# Patient Record
Sex: Female | Born: 1995
Health system: Southern US, Community
[De-identification: ages and names within clinical notes are randomized; demographics above are authoritative.]

---
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
## 2017-01-02 LAB — CULTURE, GROUP A STREP (THRC)

## 2017-03-13 ENCOUNTER — Encounter (HOSPITAL_BASED_OUTPATIENT_CLINIC_OR_DEPARTMENT_OTHER): Payer: Self-pay | Admitting: Emergency Medicine

## 2017-03-13 ENCOUNTER — Emergency Department (HOSPITAL_BASED_OUTPATIENT_CLINIC_OR_DEPARTMENT_OTHER)
Admission: EM | Admit: 2017-03-13 | Discharge: 2017-03-13 | Disposition: A | Discharge status: Home / Self Care | Payer: Medicaid Other | Attending: Emergency Medicine | Admitting: Emergency Medicine

## 2017-03-13 DIAGNOSIS — X58XXXA Exposure to other specified factors, initial encounter: Secondary | ICD-10-CM | POA: Insufficient documentation

## 2017-03-13 DIAGNOSIS — Y929 Unspecified place or not applicable: Secondary | ICD-10-CM | POA: Insufficient documentation

## 2017-03-13 DIAGNOSIS — Y939 Activity, unspecified: Secondary | ICD-10-CM | POA: Insufficient documentation

## 2017-03-13 DIAGNOSIS — Y990 Civilian activity done for income or pay: Secondary | ICD-10-CM | POA: Insufficient documentation

## 2017-03-13 DIAGNOSIS — M25531 Pain in right wrist: Secondary | ICD-10-CM | POA: Insufficient documentation

## 2017-03-13 MED ORDER — IBUPROFEN 800 MG PO TABS: 800.0000 mg | ORAL_TABLET | Freq: Three times a day (TID) | ORAL | 0 refills | Status: AC

## 2017-03-13 NOTE — ED Triage Notes (Signed)
R wrist pain x 2 weeks. No injury. Pain radiates into thumb.

## 2017-03-13 NOTE — ED Provider Notes (Signed)
[facility]   CSN: [id number] Arrival date & time: 03/13/17  1945  By signing my name below, I, Octavia Heir, attest that this documentation has been prepared under the direction and in the presence of Sharilyn Sites, PA-C.  Electronically Signed: Octavia Heir, ED Scribe. 03/13/17. 9:01 PM.    History   Chief Complaint Chief Complaint  Patient presents with  . Wrist Pain   The history is provided by the patient. No language interpreter was used.    HPI Comments: Whitney Gibson is a 21 y.o. female who is right hand dominant presents to the Emergency Department complaining of moderate, persisting right wrist pain s/p an injury that occurred 2 weeks ago. Pt says she was at work when she injured her wrist in an unknown way. She expresses for the past several days she has been having persistent pain in her right wrist that now radiates into her right thumb. Pt reports increased pain when she flexes or extends her right thumb, especially when eating. She notes she uses her thumb frequently at work, stating she types a lot on the computer. Patient is right hand dominant.  She denies numbness/weakness of right hand.  History reviewed. No pertinent past medical history.  There are no active problems to display for this patient.   History reviewed. No pertinent surgical history.  OB History    No data available       Home Medications    Prior to Admission medications   Not on File    Family History No family history on file.  Social History Social History  Substance Use Topics  . Smoking status: Never Smoker  . Smokeless tobacco: Never Used  . Alcohol use No     Allergies   Patient has no known allergies.   Review of Systems Review of Systems  Musculoskeletal: Positive for arthralgias.  All other systems reviewed and are negative.    Physical Exam Updated Vital Signs BP (!) 141/94 (BP Location: Left Arm)   Pulse (!) 105   Temp 98.3  F (36.8 C) (Oral)   Resp 18   Ht  (1.575 m)   Wt 155 lb (70.3 kg)   SpO2 100%   BMI 28.35 kg/m   Physical Exam  Constitutional: She is oriented to person, place, and time. She appears well-developed and well-nourished.  HENT:  Head: Normocephalic and atraumatic.  Mouth/Throat: Oropharynx is clear and moist.  Eyes: Conjunctivae and EOM are normal. Pupils are equal, round, and reactive to light.  Neck: Normal range of motion.  Cardiovascular: Normal rate, regular rhythm and normal heart sounds.   Pulmonary/Chest: Effort normal and breath sounds normal.  Abdominal: Soft. Bowel sounds are normal. She exhibits no distension. There is no rebound and no guarding.  Musculoskeletal: Normal range of motion.  Right wrist overall normal in appearance without visible swelling or bony deformity, no signs,, pain reproduced with movement of the right thumb as well as inversion and eversion of the wrist, normal strength, no wrist drop, negative Tinel's and Phalen's sign, strong radial pulse and cap refill, normal sensation throughout hand and all fingers  Neurological: She is alert and oriented to person, place, and time.  Skin: Skin is warm and dry.  Psychiatric: She has a normal mood and affect.  Nursing note and vitals reviewed.    ED Treatments / Results  DIAGNOSTIC STUDIES: Oxygen Saturation is 100% on RA, normal by my interpretation.  COORDINATION OF CARE:  9:00 PM Discussed  treatment plan with pt at bedside and pt agreed to plan.  Labs (all labs ordered are listed, but only abnormal results are displayed) Labs Reviewed - No data to display  EKG  EKG Interpretation None       Radiology No results found.  Procedures Procedures (including critical care time)  Medications Ordered in ED Medications - No data to display   Initial Impression / Assessment and Plan / ED Course  I have reviewed the triage vital signs and the nursing notes.  Pertinent labs & imaging  results that were available during my care of the patient were reviewed by me and considered in my medical decision making (see chart for details).  21 year old female here with atraumatic right wrist pain. She has no swelling or bony deformity noted on exam. She does have pain with movement of the thumb as well as eversion and inversion of the wrist. She has normal grip strength, no wrist drop. No overlying erythema or warmth to touch. Not clinically consistent with fracture, dislocation, or septic joint. Feel this is likely tendinitis. She does admit doing repetitive motions at work and types on the computer a lot.  We'll place a wrist splint for comfort, start anti-inflammatories. She was given hand surgery follow-up for any ongoing issues.  Discussed plan with patient, she acknowledged understanding and agreed with plan of care.  Return precautions given for new or worsening symptoms.  Final Clinical Impressions(s) / ED Diagnoses   Final diagnoses:  Right wrist pain    New Prescriptions Discharge Medication List as of 03/13/2017  9:27 PM    START taking these medications   Details  ibuprofen (ADVIL,MOTRIN) 800 MG tablet Take 1 tablet (800 mg total) by mouth 3 (three) times daily., Starting Sun 03/13/2017, Print       I personally performed the services described in this documentation, which was scribed in my presence. The recorded information has been reviewed and is accurate.   Garlon Hatchet, PA-C 03/13/17 2212    Lyndal Pulley, MD 03/14/17 [phone]

## 2017-03-13 NOTE — ED Notes (Signed)
Pt given d/c instructions as per chart. Rx x 1. Verbalizes understanding. No questions. 

## 2017-03-13 NOTE — Discharge Instructions (Signed)
Take the prescribed medication as directed.  Wear wrist splint for the next week or so until wrist is feeling better. Follow-up with hand specialist if you continue having ongoing issues. Return to the ED for new or worsening symptoms.

## 2017-07-16 DIAGNOSIS — K353 Acute appendicitis with localized peritonitis: Principal | ICD-10-CM | POA: Insufficient documentation

## 2017-07-17 ENCOUNTER — Emergency Department (HOSPITAL_COMMUNITY): Payer: Self-pay | Admitting: Certified Registered"

## 2017-07-17 ENCOUNTER — Encounter (HOSPITAL_BASED_OUTPATIENT_CLINIC_OR_DEPARTMENT_OTHER): Payer: Self-pay | Admitting: Emergency Medicine

## 2017-07-17 ENCOUNTER — Encounter (HOSPITAL_COMMUNITY)
Admission: EM | Disposition: A | Discharge status: Home / Self Care | Payer: Self-pay | Source: Home / Self Care | Attending: Emergency Medicine

## 2017-07-17 ENCOUNTER — Emergency Department (HOSPITAL_BASED_OUTPATIENT_CLINIC_OR_DEPARTMENT_OTHER): Payer: Self-pay

## 2017-07-17 ENCOUNTER — Observation Stay (HOSPITAL_BASED_OUTPATIENT_CLINIC_OR_DEPARTMENT_OTHER)
Admission: EM | Admit: 2017-07-17 | Discharge: 2017-07-18 | Disposition: A | Discharge status: Home / Self Care | Payer: Self-pay | Attending: Surgery | Admitting: Surgery

## 2017-07-17 DIAGNOSIS — K3580 Unspecified acute appendicitis: Secondary | ICD-10-CM | POA: Diagnosis present

## 2017-07-17 HISTORY — PX: LAPAROSCOPIC APPENDECTOMY: SHX408

## 2017-07-17 LAB — URINALYSIS, ROUTINE W REFLEX MICROSCOPIC
Bilirubin Urine: NEGATIVE
Glucose, UA: NEGATIVE mg/dL
Hgb urine dipstick: NEGATIVE
Ketones, ur: NEGATIVE mg/dL
Leukocytes, UA: NEGATIVE
Nitrite: NEGATIVE
Protein, ur: NEGATIVE mg/dL
Specific Gravity, Urine: 1.029 (ref 1.005–1.030)
pH: 7 (ref 5.0–8.0)

## 2017-07-17 LAB — BASIC METABOLIC PANEL
Anion gap: 8 (ref 5–15)
BUN: 10 mg/dL (ref 6–20)
CO2: 26 mmol/L (ref 22–32)
Calcium: 9.1 mg/dL (ref 8.9–10.3)
Chloride: 104 mmol/L (ref 101–111)
Creatinine, Ser: 0.6 mg/dL (ref 0.44–1.00)
GFR calc Af Amer: >60 mL/min (ref >60)
GFR calc non Af Amer: >60 mL/min (ref >60)
Glucose, Bld: 122 mg/dL — ABNORMAL HIGH (ref 65–99)
Potassium: 3.2 mmol/L — ABNORMAL LOW (ref 3.5–5.1)
Sodium: 138 mmol/L (ref 135–145)

## 2017-07-17 LAB — CBC WITH DIFFERENTIAL/PLATELET
Basophils Absolute: 0 10*3/uL (ref 0.0–0.1)
Basophils Relative: 0 %
Eosinophils Absolute: 0.1 10*3/uL (ref 0.0–0.7)
Eosinophils Relative: 1 %
HCT: 38.9 % (ref 36.0–46.0)
Hemoglobin: 13.2 g/dL (ref 12.0–15.0)
Lymphocytes Relative: 21 %
Lymphs Abs: 2.3 10*3/uL (ref 0.7–4.0)
MCH: 31.4 pg (ref 26.0–34.0)
MCHC: 33.9 g/dL (ref 30.0–36.0)
MCV: 92.6 fL (ref 78.0–100.0)
Monocytes Absolute: 1.1 10*3/uL — ABNORMAL HIGH (ref 0.1–1.0)
Monocytes Relative: 9 %
Neutro Abs: 7.8 10*3/uL — ABNORMAL HIGH (ref 1.7–7.7)
Neutrophils Relative %: 69 %
Platelets: 248 10*3/uL (ref 150–400)
RBC: 4.2 MIL/uL (ref 3.87–5.11)
RDW: 12 % (ref 11.5–15.5)
WBC: 11.3 10*3/uL — ABNORMAL HIGH (ref 4.0–10.5)

## 2017-07-17 LAB — PREGNANCY, URINE: Preg Test, Ur: NEGATIVE

## 2017-07-17 SURGERY — APPENDECTOMY, LAPAROSCOPIC
Anesthesia: General | Site: Abdomen

## 2017-07-17 MED ORDER — BUPIVACAINE-EPINEPHRINE 0.25% -1:200000 IJ SOLN
INTRAMUSCULAR | Status: DC | PRN
  Administered 2017-07-17: 30 mL

## 2017-07-17 MED ORDER — HYDROMORPHONE HCL-NACL 0.5-0.9 MG/ML-% IV SOSY: 0.2500 mg | PREFILLED_SYRINGE | INTRAVENOUS | Status: DC | PRN

## 2017-07-17 MED ORDER — SCOPOLAMINE 1 MG/3DAYS TD PT72
MEDICATED_PATCH | TRANSDERMAL | Status: AC
  Filled 2017-07-17: qty 1

## 2017-07-17 MED ORDER — ROCURONIUM BROMIDE 10 MG/ML (PF) SYRINGE
PREFILLED_SYRINGE | INTRAVENOUS | Status: DC | PRN
  Administered 2017-07-17: 30 mg via INTRAVENOUS

## 2017-07-17 MED ORDER — ONDANSETRON 4 MG PO TBDP: 4.0000 mg | ORAL_TABLET | Freq: Four times a day (QID) | ORAL | Status: DC | PRN

## 2017-07-17 MED ORDER — PROMETHAZINE HCL 25 MG/ML IJ SOLN: 6.2500 mg | INTRAMUSCULAR | Status: DC | PRN

## 2017-07-17 MED ORDER — MIDAZOLAM HCL 2 MG/2ML IJ SOLN
INTRAMUSCULAR | Status: AC
  Filled 2017-07-17: qty 2

## 2017-07-17 MED ORDER — HYDROMORPHONE HCL-NACL 0.5-0.9 MG/ML-% IV SOSY
PREFILLED_SYRINGE | INTRAVENOUS | Status: AC
  Filled 2017-07-17: qty 1

## 2017-07-17 MED ORDER — 0.9 % SODIUM CHLORIDE (POUR BTL) OPTIME
TOPICAL | Status: DC | PRN
  Administered 2017-07-17: 1000 mL

## 2017-07-17 MED ORDER — CEFTRIAXONE SODIUM 2 G IJ SOLR
2.0000 g | INTRAMUSCULAR | Status: DC
  Administered 2017-07-18: 2 g via INTRAVENOUS
  Filled 2017-07-17: qty 2

## 2017-07-17 MED ORDER — ONDANSETRON HCL 4 MG/2ML IJ SOLN
4.0000 mg | Freq: Four times a day (QID) | INTRAMUSCULAR | Status: DC | PRN
  Administered 2017-07-17: 4 mg via INTRAVENOUS
  Filled 2017-07-17: qty 2

## 2017-07-17 MED ORDER — PROPOFOL 10 MG/ML IV BOLUS
INTRAVENOUS | Status: AC
  Filled 2017-07-17: qty 20

## 2017-07-17 MED ORDER — TRAMADOL HCL 50 MG PO TABS: 50.0000 mg | ORAL_TABLET | Freq: Four times a day (QID) | ORAL | Status: DC | PRN

## 2017-07-17 MED ORDER — PROPOFOL 10 MG/ML IV BOLUS
INTRAVENOUS | Status: DC | PRN
  Administered 2017-07-17: 180 mg via INTRAVENOUS

## 2017-07-17 MED ORDER — SUCCINYLCHOLINE CHLORIDE 200 MG/10ML IV SOSY
PREFILLED_SYRINGE | INTRAVENOUS | Status: DC | PRN
  Administered 2017-07-17: 120 mg via INTRAVENOUS

## 2017-07-17 MED ORDER — KETOROLAC TROMETHAMINE 15 MG/ML IJ SOLN
15.0000 mg | Freq: Once | INTRAMUSCULAR | Status: AC
  Administered 2017-07-17: 15 mg via INTRAVENOUS
  Filled 2017-07-17: qty 1

## 2017-07-17 MED ORDER — SUGAMMADEX SODIUM 200 MG/2ML IV SOLN
INTRAVENOUS | Status: DC | PRN
  Administered 2017-07-17: 200 mg via INTRAVENOUS

## 2017-07-17 MED ORDER — LACTATED RINGERS IR SOLN
Status: DC | PRN
  Administered 2017-07-17: 3000 mL

## 2017-07-17 MED ORDER — DEXTROSE 5 % IV SOLN
2.0000 g | Freq: Once | INTRAVENOUS | Status: AC
  Administered 2017-07-17: 2 g via INTRAVENOUS
  Filled 2017-07-17: qty 2

## 2017-07-17 MED ORDER — ONDANSETRON HCL 4 MG/2ML IJ SOLN
4.0000 mg | Freq: Once | INTRAMUSCULAR | Status: AC
  Administered 2017-07-17: 4 mg via INTRAVENOUS
  Filled 2017-07-17: qty 2

## 2017-07-17 MED ORDER — DEXAMETHASONE SODIUM PHOSPHATE 10 MG/ML IJ SOLN
INTRAMUSCULAR | Status: DC | PRN
  Administered 2017-07-17: 10 mg via INTRAVENOUS

## 2017-07-17 MED ORDER — FENTANYL CITRATE (PF) 250 MCG/5ML IJ SOLN
INTRAMUSCULAR | Status: AC
  Filled 2017-07-17: qty 5

## 2017-07-17 MED ORDER — SODIUM CHLORIDE 0.9 % IV BOLUS (SEPSIS)
1000.0000 mL | Freq: Once | INTRAVENOUS | Status: AC
  Administered 2017-07-17: 1000 mL via INTRAVENOUS

## 2017-07-17 MED ORDER — METRONIDAZOLE IN NACL 5-0.79 MG/ML-% IV SOLN
500.0000 mg | Freq: Once | INTRAVENOUS | Status: AC
  Administered 2017-07-17: 500 mg via INTRAVENOUS
  Filled 2017-07-17: qty 100

## 2017-07-17 MED ORDER — FENTANYL CITRATE (PF) 250 MCG/5ML IJ SOLN
INTRAMUSCULAR | Status: DC | PRN
  Administered 2017-07-17: 100 ug via INTRAVENOUS
  Administered 2017-07-17 (×3): 50 ug via INTRAVENOUS

## 2017-07-17 MED ORDER — MORPHINE SULFATE (PF) 4 MG/ML IV SOLN
4.0000 mg | Freq: Once | INTRAVENOUS | Status: AC
  Administered 2017-07-17: 4 mg via INTRAVENOUS
  Filled 2017-07-17: qty 1

## 2017-07-17 MED ORDER — MORPHINE SULFATE (PF) 2 MG/ML IV SOLN
1.0000 mg | INTRAVENOUS | Status: DC | PRN
  Administered 2017-07-17: 1 mg via INTRAVENOUS
  Filled 2017-07-17: qty 1

## 2017-07-17 MED ORDER — LIDOCAINE 2% (20 MG/ML) 5 ML SYRINGE
INTRAMUSCULAR | Status: DC | PRN
  Administered 2017-07-17: 80 mg via INTRAVENOUS

## 2017-07-17 MED ORDER — ONDANSETRON HCL 4 MG/2ML IJ SOLN
INTRAMUSCULAR | Status: DC | PRN
  Administered 2017-07-17: 4 mg via INTRAVENOUS

## 2017-07-17 MED ORDER — IOPAMIDOL (ISOVUE-300) INJECTION 61%
100.0000 mL | Freq: Once | INTRAVENOUS | Status: AC | PRN
  Administered 2017-07-17: 100 mL via INTRAVENOUS

## 2017-07-17 MED ORDER — KCL IN DEXTROSE-NACL 20-5-0.45 MEQ/L-%-% IV SOLN
INTRAVENOUS | Status: DC
  Administered 2017-07-17 – 2017-07-18 (×2): via INTRAVENOUS
  Filled 2017-07-17 (×2): qty 1000

## 2017-07-17 MED ORDER — ENOXAPARIN SODIUM 40 MG/0.4ML ~~LOC~~ SOLN
40.0000 mg | SUBCUTANEOUS | Status: DC
  Administered 2017-07-18: 40 mg via SUBCUTANEOUS
  Filled 2017-07-17: qty 0.4

## 2017-07-17 MED ORDER — BUPIVACAINE-EPINEPHRINE (PF) 0.25% -1:200000 IJ SOLN
INTRAMUSCULAR | Status: AC
  Filled 2017-07-17: qty 30

## 2017-07-17 MED ORDER — MIDAZOLAM HCL 2 MG/2ML IJ SOLN
INTRAMUSCULAR | Status: DC | PRN
  Administered 2017-07-17: 2 mg via INTRAVENOUS

## 2017-07-17 MED ORDER — HYDROCODONE-ACETAMINOPHEN 5-325 MG PO TABS
1.0000 | ORAL_TABLET | ORAL | Status: DC | PRN
  Administered 2017-07-17 – 2017-07-18 (×2): 1 via ORAL
  Filled 2017-07-17: qty 1
  Filled 2017-07-17: qty 2

## 2017-07-17 MED ORDER — METRONIDAZOLE IN NACL 5-0.79 MG/ML-% IV SOLN
500.0000 mg | Freq: Three times a day (TID) | INTRAVENOUS | Status: DC
  Administered 2017-07-17 – 2017-07-18 (×3): 500 mg via INTRAVENOUS
  Filled 2017-07-17 (×4): qty 100

## 2017-07-17 MED ORDER — LACTATED RINGERS IV SOLN
INTRAVENOUS | Status: DC | PRN
  Administered 2017-07-17 (×2): via INTRAVENOUS

## 2017-07-17 SURGICAL SUPPLY — 34 items
APPLIER CLIP ROT 10 11.4 M/L (STAPLE)
BENZOIN TINCTURE PRP APPL 2/3 (GAUZE/BANDAGES/DRESSINGS) IMPLANT
CABLE HIGH FREQUENCY MONO STRZ (ELECTRODE) ×3 IMPLANT
CHLORAPREP W/TINT 26ML (MISCELLANEOUS) ×3 IMPLANT
CLIP APPLIE ROT 10 11.4 M/L (STAPLE) IMPLANT
CLOSURE WOUND 1/2 X4 (GAUZE/BANDAGES/DRESSINGS)
COVER SURGICAL LIGHT HANDLE (MISCELLANEOUS) ×3 IMPLANT
CUTTER FLEX LINEAR 45M (STAPLE) ×3 IMPLANT
DECANTER SPIKE VIAL GLASS SM (MISCELLANEOUS) ×3 IMPLANT
DERMABOND ADVANCED (GAUZE/BANDAGES/DRESSINGS) ×2
DERMABOND ADVANCED .7 DNX12 (GAUZE/BANDAGES/DRESSINGS) ×1 IMPLANT
DRAPE LAPAROSCOPIC ABDOMINAL (DRAPES) ×3 IMPLANT
ELECT REM PT RETURN 15FT ADLT (MISCELLANEOUS) ×3 IMPLANT
ENDOLOOP SUT PDS II  0 18 (SUTURE)
ENDOLOOP SUT PDS II 0 18 (SUTURE) IMPLANT
GLOVE SURG SIGNA 7.5 PF LTX (GLOVE) ×3 IMPLANT
GOWN STRL REUS W/TWL XL LVL3 (GOWN DISPOSABLE) ×6 IMPLANT
IRRIG SUCT STRYKERFLOW 2 WTIP (MISCELLANEOUS) ×3
IRRIGATION SUCT STRKRFLW 2 WTP (MISCELLANEOUS) ×1 IMPLANT
KIT BASIN OR (CUSTOM PROCEDURE TRAY) ×3 IMPLANT
POUCH SPECIMEN RETRIEVAL 10MM (ENDOMECHANICALS) ×3 IMPLANT
RELOAD 45 VASCULAR/THIN (ENDOMECHANICALS) IMPLANT
RELOAD STAPLE TA45 3.5 REG BLU (ENDOMECHANICALS) ×3 IMPLANT
SCISSORS LAP 5X35 DISP (ENDOMECHANICALS) ×3 IMPLANT
SHEARS HARMONIC ACE PLUS 36CM (ENDOMECHANICALS) ×3 IMPLANT
SLEEVE XCEL OPT CAN 5 100 (ENDOMECHANICALS) ×3 IMPLANT
STRIP CLOSURE SKIN 1/2X4 (GAUZE/BANDAGES/DRESSINGS) IMPLANT
SUT MNCRL AB 4-0 PS2 18 (SUTURE) ×3 IMPLANT
SUT VIC AB 2-0 SH 18 (SUTURE) IMPLANT
TOWEL OR 17X26 10 PK STRL BLUE (TOWEL DISPOSABLE) ×3 IMPLANT
TOWEL OR NON WOVEN STRL DISP B (DISPOSABLE) ×3 IMPLANT
TRAY LAPAROSCOPIC (CUSTOM PROCEDURE TRAY) ×3 IMPLANT
TROCAR BLADELESS OPT 5 100 (ENDOMECHANICALS) ×3 IMPLANT
TROCAR XCEL BLUNT TIP 100MML (ENDOMECHANICALS) ×3 IMPLANT

## 2017-07-17 NOTE — ED Provider Notes (Signed)
[facility]   CSN: [id number] Arrival date & time: 07/16/17  2343   By signing my name below, I, Whitney Gibson, attest that this documentation has been prepared under the direction and in the presence of Whitney Gibson, Whitney Choudhry, MD. Electronically signed, Whitney Gibson, ED Scribe. 07/17/17. 1:40 AM.   History   Chief Complaint Chief Complaint  Patient presents with  . Pelvic Pain   The history is provided by the patient and medical records. No language interpreter was used.    Whitney Gibson is a 21 y.o. female presenting to the Emergency Department concerning R lower abdominal pain x ~24 hours. Associated nausea. She describes 7/10, constant aching and states movement makes her pain worse. She states she has taken Tylenol with mild relief. H/o simlar noted "a few years ago" and 1 episode "a few weeks ago" that subsided after she took tylenol. No h/o abdominal surgeries. No bleeding, discharge, fever, chills or any other complaints noted at this time.   History reviewed. No pertinent past medical history.  There are no active problems to display for this patient.   History reviewed. No pertinent surgical history.  OB History    No data available       Home Medications    Prior to Admission medications   Medication Sig Start Date End Date Taking? Authorizing Provider  ibuprofen (ADVIL,MOTRIN) 800 MG tablet Take 1 tablet (800 mg total) by mouth 3 (three) times daily. 03/13/17   Garlon HatchetSanders, Lisa M, PA-C    Family History History reviewed. No pertinent family history.  Social History Social History  Substance Use Topics  . Smoking status: Never Smoker  . Smokeless tobacco: Never Used  . Alcohol use No     Allergies   Patient has no known allergies.   Review of Systems Review of Systems  Constitutional: Negative for chills, diaphoresis and fever.  Gastrointestinal: Positive for abdominal pain and nausea. Negative for diarrhea and vomiting.  Skin:  Negative for color change and wound.  Neurological: Negative for weakness and numbness.  All other systems reviewed and are negative.    Physical Exam Updated Vital Signs BP (!) 142/79 (BP Location: Right Arm)   Pulse 100   Temp 98.2 F (36.8 C) (Oral)   Resp 19   Ht 5\' 4"  (1.626 m)   Wt 170 lb (77.1 kg)   LMP 07/16/2017   SpO2 100%   BMI 29.18 kg/m   Physical Exam  Abdominal: There is tenderness in the right lower quadrant and suprapubic area. There is guarding (voluntary guarding).     ED Treatments / Results  DIAGNOSTIC STUDIES: Oxygen Saturation is 100% on RA, NL by my interpretation.    COORDINATION OF CARE: 1:39 AM-Discussed next steps with pt. Pt verbalized understanding and is agreeable with the plan. Will order blood work and labs.   Labs (all labs ordered are listed, but only abnormal results are displayed) Labs Reviewed  CBC WITH DIFFERENTIAL/PLATELET - Abnormal; Notable for the following:       Result Value   WBC 11.3 (*)    Neutro Abs 7.8 (*)    Monocytes Absolute 1.1 (*)    All other components within normal limits  BASIC METABOLIC PANEL - Abnormal; Notable for the following:    Potassium 3.2 (*)    Glucose, Bld 122 (*)    All other components within normal limits  PREGNANCY, URINE  URINALYSIS, ROUTINE W REFLEX MICROSCOPIC    EKG  EKG Interpretation None  Radiology Ct Abdomen Pelvis W Contrast  Result Date: 07/17/2017 CLINICAL DATA:  Initial evaluation for acute abdominal pain. EXAM: CT ABDOMEN AND PELVIS WITH CONTRAST TECHNIQUE: Multidetector CT imaging of the abdomen and pelvis was performed using the standard protocol following bolus administration of intravenous contrast. CONTRAST:  100mL ISOVUE-300 IOPAMIDOL (ISOVUE-300) INJECTION 61% COMPARISON:  None. FINDINGS: Lower chest: Visualized lung bases are clear. Hepatobiliary: Liver within normal limits. Gallbladder normal. No biliary dilatation. Pancreas: Pancreas within normal limits.  Spleen: Spleen within normal limits. Adrenals/Urinary Tract: The adrenal glands are normal. Kidneys equal size with symmetric enhancement. No nephrolithiasis, hydronephrosis, or focal enhancing renal mass. No hydroureter. Bladder within normal limits. Stomach/Bowel: Stomach within normal limits. No evidence for bowel obstruction. Tubular structure extending medially from the cecum favored to reflect the appendix measures up to 13 mm (series 2, image 52). Adjacent hazy inflammatory stranding, suspicious for acute appendicitis. Relative sparing of the base of the appendix. No evidence for perforation or other complication. No other acute inflammatory changes seen about the bowels. Vascular/Lymphatic: Normal intravascular enhancement seen throughout the intra-abdominal aorta and its branch vessels. No pathologically enlarged intra-abdominal or pelvic lymph nodes. Reproductive: Uterus and ovaries within normal limits for age. Other: No free intraperitoneal air.  No significant free fluid. Musculoskeletal: No acute osseus abnormality. No worrisome lytic or blastic osseous lesions. IMPRESSION: Findings consistent with acute appendicitis. No evidence for perforation or other complication. Electronically Signed   By: Rise MuBenjamin  McClintock M.D.   On: 07/17/2017 03:28    Procedures Procedures (including critical care time)  Medications Ordered in ED Medications - No data to display   Initial Impression / Assessment and Plan / ED Course  I have reviewed the triage vital signs and the nursing notes.  Pertinent labs & imaging results that were available during my care of the patient were reviewed by me and considered in my medical decision making (see chart for details).     21yF with lower abdominal/RLQ pain. CT with likely appendicitis w/o perf or abscess. NPO. Abx. Surgery consultation.   3:52 AM Discussed with Dr Ezzard StandingNewman, surgery. [facility] Pt/family updated.    Final Clinical Impressions(s) /  ED Diagnoses   Final diagnoses:  Acute appendicitis, unspecified acute appendicitis type    New Prescriptions New Prescriptions   No medications on file    I personally preformed the services scribed in my presence. The recorded information has been reviewed is accurate. Whitney RazorStephen Louvina Cleary, MD.    Whitney Gibson, Mattison Golay, MD 07/17/17 [phone]

## 2017-07-17 NOTE — Anesthesia Preprocedure Evaluation (Addendum)
Anesthesia Evaluation  Patient identified by MRN, date of birth, ID band Patient awake    Reviewed: Allergy & Precautions, NPO status , Patient's Chart, lab work & pertinent test results  History of Anesthesia Complications Negative for: history of anesthetic complications  Airway Mallampati: II  TM Distance: >3 FB Neck ROM: Full    Dental no notable dental hx. (+) Dental Advisory Given   Pulmonary neg pulmonary ROS,    Pulmonary exam normal        Cardiovascular negative cardio ROS Normal cardiovascular exam     Neuro/Psych negative neurological ROS     GI/Hepatic Neg liver ROS,   Endo/Other  negative endocrine ROS  Renal/GU negative Renal ROS     Musculoskeletal negative musculoskeletal ROS (+)   Abdominal   Peds  Hematology negative hematology ROS (+)   Anesthesia Other Findings Day of surgery medications reviewed with the patient.  Reproductive/Obstetrics                            Anesthesia Physical Anesthesia Plan  ASA: II and emergent  Anesthesia Plan: General   Post-op Pain Management:    Induction: Intravenous, Rapid sequence and Cricoid pressure planned  PONV Risk Score and Plan: 4 or greater and Ondansetron, Dexamethasone and Scopolamine patch - Pre-op  Airway Management Planned: Oral ETT  Additional Equipment:   Intra-op Plan:   Post-operative Plan: Extubation in OR  Informed Consent: I have reviewed the patients History and Physical, chart, labs and discussed the procedure including the risks, benefits and alternatives for the proposed anesthesia with the patient or authorized representative who has indicated his/her understanding and acceptance.   Dental advisory given  Plan Discussed with: CRNA, Anesthesiologist and Surgeon  Anesthesia Plan Comments:        Anesthesia Quick Evaluation

## 2017-07-17 NOTE — Anesthesia Procedure Notes (Signed)
Procedure Name: Intubation Date/Time: 07/17/2017 8:40 AM Performed by: Minerva EndsMIRARCHI, Michoel Kunin M Pre-anesthesia Checklist: Patient identified, Emergency Drugs available, Suction available, Patient being monitored and Timeout performed Patient Re-evaluated:Patient Re-evaluated prior to induction Oxygen Delivery Method: Circle System Utilized Preoxygenation: Pre-oxygenation with 100% oxygen Induction Type: IV induction, Rapid sequence and Cricoid Pressure applied Laryngoscope Size: Miller and 2 Grade View: Grade I Tube type: Oral Tube size: 7.0 mm Number of attempts: 1 Airway Equipment and Method: Stylet Placement Confirmation: ETT inserted through vocal cords under direct vision,  positive ETCO2 and breath sounds checked- equal and bilateral Secured at: 21 cm Tube secured with: Tape Dental Injury: Teeth and Oropharynx as per pre-operative assessment  Comments: Smooth IV induction Krista BlueSinger--- intubation AM CRNA atraumatic-- teeth and mouth as preop--- irregular surfaces front teeth and chipped tooth upper back-- bilat BS NiSourceSinger

## 2017-07-17 NOTE — ED Notes (Signed)
Pelvic cart outside room.   

## 2017-07-17 NOTE — Anesthesia Postprocedure Evaluation (Signed)
Anesthesia Post Note  Patient: Whitney Gibson  Procedure(s) Performed: Procedure(s) (LRB): APPENDECTOMY LAPAROSCOPIC (N/A)     Patient location during evaluation: PACU Anesthesia Type: General Level of consciousness: sedated Pain management: pain level controlled Vital Signs Assessment: post-procedure vital signs reviewed and stable Respiratory status: spontaneous breathing and respiratory function stable Cardiovascular status: stable Anesthetic complications: no    Last Vitals:  Vitals:   07/17/17 1100 07/17/17 1121  BP: (!) 141/72 (!) 146/84  Pulse: 92 100  Resp: 14 14  Temp: 36.5 C 36.9 C    Last Pain:  Vitals:   07/17/17 1137  TempSrc:   PainSc: 2                  Sivan Quast DANIEL

## 2017-07-17 NOTE — Transfer of Care (Signed)
Immediate Anesthesia Transfer of Care Note  Patient: Whitney Gibson  Procedure(s) Performed: Procedure(s): APPENDECTOMY LAPAROSCOPIC (N/A)  Patient Location: PACU  Anesthesia Type:General  Level of Consciousness: awake  Airway & Oxygen Therapy: Patient Spontanous Breathing and Patient connected to face mask oxygen  Post-op Assessment: Report given to RN and Post -op Vital signs reviewed and stable  Post vital signs: Reviewed and stable  Last Vitals:  Vitals:   07/17/17 0438 07/17/17 0604  BP: 139/87 125/86  Pulse: 100 92  Resp: 20 18  Temp:  36.6 C    Last Pain:  Vitals:   07/17/17 0658  TempSrc:   PainSc: 4       Patients Stated Pain Goal: 0 (07/17/17 0604)  Complications: No apparent anesthesia complications

## 2017-07-17 NOTE — Progress Notes (Signed)
Periodically awakens and C/O pain bit falls immediately to sleep and sleeps soundly, grunts occasionally, requesting to go to a room to see mom and boyfriend.

## 2017-07-17 NOTE — ED Provider Notes (Addendum)
[facility]. Patient is overall well-appearing. Does report some abdominal discomfort and nausea. Requesting some nausea medicine and pain medicine. Vital signs remained reassuring and hemodynamically stable. Dr. Ezzard StandingNewman was paged and spoke with him and states that he will come to the ED to evaluate patient and prep patient for OR.    Rise MuLeaphart, Jatavious Peppard T, PA-C 07/17/17 [phone]    Devoria AlbeKnapp, Iva, MD 07/17/17 0709    Rise MuLeaphart, Maurissa Ambrose T, PA-C 07/17/17 0710    Devoria AlbeKnapp, Iva, MD 07/17/17 [phone]

## 2017-07-17 NOTE — ED Notes (Signed)
To OR via strtcher

## 2017-07-17 NOTE — ED Notes (Signed)
Patient has been wiped with hcg wipes, clothes and jewelry has been taken off.

## 2017-07-17 NOTE — ED Triage Notes (Signed)
Patient states that she woke up at 2 am with pain to her right lower quad region and right pelvic region. Patient reports nausea denies any V/D

## 2017-07-17 NOTE — Anesthesia Procedure Notes (Signed)
Date/Time: 07/17/2017 9:37 AM Performed by: Minerva EndsMIRARCHI, Loren Vicens M Oxygen Delivery Method: Simple face mask Placement Confirmation: breath sounds checked- equal and bilateral and positive ETCO2 Comments: Extubated-- combative--- face mask --- good AW

## 2017-07-17 NOTE — Op Note (Signed)
Re:   Whitney Gibson DOB:   02/13/1996 MRN:   [id number]                   FACILITY:  [facility] OR  DATE OF PROCEDURE: 07/17/2017                              OPERATIVE REPORT  PREOPERATIVE DIAGNOSIS:  Appendicitis  POSTOPERATIVE DIAGNOSIS:  Acute purulent appendicitis.  PROCEDURE:  Laparoscopic appendectomy.  SURGEON:  Sandria Balesavid H. Ezzard StandingNewman, MD  ASSISTANT:  No first assistant.  ANESTHESIA:  General endotracheal.  Anesthesiologist: Heather RobertsSinger, James, MD CRNA: Minerva EndsMirarchi, Angela M, CRNA  ASA:  1E  ESTIMATED BLOOD LOSS:  Minimal.  DRAINS: none   SPECIMEN:   Appendix  COUNTS CORRECT:  YES  INDICATIONS FOR PROCEDURE: Whitney Gibson is a 21 y.o. (DOB: 11/23/1996) AA female whose primary care doctor is Dahlia BailiffWright, Darren, MD and comes to the OR for an appendectomy.   I discussed with the patient, the indications and potential complications of appendiceal surgery.  The potential complications include, but are not limited to, bleeding, open surgery, bowel resection, and the possibility of another diagnosis.  OPERATIVE NOTE:  The patient underwent a general endotracheal anesthetic as supervised by Anesthesiologist: Heather RobertsSinger, James, MD CRNA: Minerva EndsMirarchi, Angela M, CRNA, General, [facility].  The patient was given Rocephin and Flagyl prior to the beginning of the procedure and the abdomen was prepped with ChloraPrep.   A time-out was held and surgical checklist run.  An infraumbilical incision was made with sharp dissection carried down to the abdominal cavity.  An 12 mm Hasson trocar was inserted through the infraumbilical incision and into the peritoneal cavity.  A 0 degree 10 mm laparoscope was inserted through a 12 mm Hasson trocar and the Hasson trocar secured with a 0 Vicryl suture.  I placed a 5 mm trocar in the right upper quadrant and a 5 mm torcar in left lower quadrant and did abdominal exploration.    The right and left lobes of liver unremarkable.  Stomach was unremarkable.  The pelvic organs  were unremarkable.  I saw no other intra-abdominal abnormality.  The patient had appendicitis with the appendix wrapped in the mesentery of the terminal ileum.  It was purulent, thickened, but not ruptured..  The mesentery of the appendix was divided with a Harmonic scalpel.  I got to the base of the appendix.  I then used a blue load 45 mm Ethicon Endo-GIA stapler and fired this across the base of the appendix.  I placed the appendix in EndoCatch bag and delivered the bag through the umbilical incision.  I irrigated the abdomen with 1,000 cc of saline.  After irrigating the abdomen, I then removed the trocars, in turn.  The umbilical port fascia was closed with 0 Vicryl suture.   I closed the skin each site with a 4-0 Monocryl suture and painted the wounds with DermaBond.  I then injected a total of 30 mL of 0.25% Marcaine at the incisions.  Sponge and needle count were correct at the end of the case.  The patient was transferred to the recovery room in good condition.  The patient tolerated the procedure well and it depends on the patient's post op clinical course as to when the patient could be discharged.   Ovidio Kinavid Marti Mclane, MD, [facility] Pager: [phone] Office phone:  [phone]

## 2017-07-17 NOTE — ED Notes (Signed)
Whitney GoldsmithSherry Tadros (mother) has permission to get info and make decisions for patient.

## 2017-07-17 NOTE — H&P (Signed)
Re:   Whitney Gibson DOB:   04/05/1996 MRN:   [id number]   [facility] Admission  Chief Complaint Abdominal pain  ASSESEMENT AND PLAN: 1.  Acute Appendicitis  I discussed with the patient the indications and risks of appendiceal surgery.  The primary risks of appendiceal surgery include, but are not limited to, bleeding, infection, bowel surgery, and open surgery.  There is also the risk that the patient may have continued symptoms after surgery.  We discussed the typical post-operative recovery course. I tried to answer the patient's questions.   Chief Complaint  Patient presents with  . Pelvic Pain   PHYSICIAN REQUESTING CONSULTATION:  Dr. Raeford RazorStephen Kohut, [facility]  HISTORY OF PRESENT ILLNESS: Whitney Gibson is a 21 y.o. (DOB: 07/22/1996)  AA female whose primary care physician is Dahlia BailiffWright, Darren, MD and [facility]. Her mother, Moises BloodSharon Etchison, and boyfriend, [place] Bennett, were at the bedside.   She developed abdominal and back pain, which began at 2 AM on Saturday, 8/4.  She took some tylenol, went to the bathroom, but this did not help the pain.  The back pain got better, but the abdominal pain got worse.  She's had some nausea, but no vomiting.  [facility].  She has no history of stomach disease, liver disease, gall bladder disease, pancreas disease, or colon disease.  CT scan abdomen/pelvis - 07/17/2017 - Findings consistent with acute appendicitis. No evidence for perforation or other complication. WBC - 07/17/2017 - 11,300   History reviewed. No pertinent past medical history.   History reviewed. No pertinent surgical history.    No current facility-administered medications for this encounter.    Current Outpatient Prescriptions  Medication Sig Dispense Refill  . ibuprofen (ADVIL,MOTRIN) 800 MG tablet Take 1 tablet (800 mg total) by mouth 3 (three) times daily. 21 tablet 0       No Known Allergies  REVIEW OF SYSTEMS: Skin:  No history of rash.  No history of abnormal moles. Infection:  No history of hepatitis or HIV.  No history of MRSA. Neurologic:  No history of stroke.  No history of seizure.  No history of headaches. Cardiac:  No history of hypertension. No history of heart disease.  No history of prior cardiac catheterization.  No history of seeing a cardiologist. Pulmonary:  Does not smoke cigarettes.  No asthma or bronchitis.  No OSA/CPAP.  Endocrine:  No diabetes. No thyroid disease. Gastrointestinal:  See HPI Urologic:  No history of kidney stones.  No history of bladder infections. Musculoskeletal:  No history of joint or back disease. Hematologic:  No bleeding disorder.  No history of anemia.  Not anticoagulated. Psycho-social:  The patient is oriented.   The patient has no obvious psychologic or social impairment to understanding our conversation and plan.  SOCIAL and FAMILY HISTORY: Unmarried. Lives by self. Her mother, Moises BloodSharon Dobie, and boyfriend, [place] Bennett, were at the bedside.  No family history of appendicitis.  PHYSICAL EXAM: BP 125/86 (BP Location: Right Arm)   Pulse 92   Temp 97.8 F (36.6 C) (Oral)   Resp 18   Ht 5\' 4"  (1.626 m)   Wt 77.1 kg (170 lb)   LMP 07/16/2017   SpO2 100%   BMI 29.18 kg/m   General: Mildly obese AA F who is alert and generally healthy appearing.  Skin:  Inspection and palpation - no mass or  rash. Eyes:  Conjunctiva and lids unremarkable.            Pupils are equal Ears, Nose, Mouth, and Throat:  Ears and nose unremarkable            Lips and teeth are unremarable. Neck: Supple. No mass, trachea midline.  No thyroid mass. Lymph Nodes:  No supraclavicular, cervical, or inguinal nodes. Lungs: Normal respiratory effort.  Clear to auscultation and symmetric breath sounds. Heart:  Palpation of the heart is normal.            Auscultation: RRR. No murmur or rub.  Abdomen: Soft. No mass. No  hernia.             Normal bowel sounds.  No abdominal scars.   Rectal: Not done. Musculoskeletal:  Digits and nails are unremarkable.            Good muscle strength and ROM  in upper and lower extremities. Neurologic:  Grossly intact to motor and sensory function. Psychiatric: Normal judgement and insight. Behavior is normal.            Oriented to time, person, place.   DATA REVIEWED, COUNSELING AND COORDINATION OF CARE: Epic notes reviewed. Counseling and coordination of care exceeded more than 50% of the time spent with patient. Total time spent with patient and charting: 45 minutes  Ovidio Kinavid Loranzo Desha, MD,  [facility] [place] [address]  [address]   [place]    [id number] Phone:  [phone] FAX:  [phone]

## 2017-07-18 ENCOUNTER — Encounter (HOSPITAL_COMMUNITY): Payer: Self-pay | Admitting: Surgery

## 2017-07-18 MED ORDER — ACETAMINOPHEN 500 MG PO TABS: 500.0000 mg | ORAL_TABLET | Freq: Three times a day (TID) | ORAL | 0 refills | Status: AC | PRN

## 2017-07-18 MED ORDER — HYDROCODONE-ACETAMINOPHEN 5-325 MG PO TABS: 1.0000 | ORAL_TABLET | ORAL | 0 refills | Status: DC | PRN

## 2017-07-18 NOTE — Discharge Instructions (Signed)

## 2017-07-18 NOTE — Discharge Summary (Signed)
[facility] Discharge Summary   Patient ID: Whitney Gibson MRN: [id number] DOB/AGE: 19/05/1996 19 y.o.  Admit date: 07/17/2017 Discharge date: 07/18/2017  Discharge Diagnosis Patient Active Problem List   Diagnosis Date Noted  . Acute appendicitis 07/17/2017   Imaging: Ct Abdomen Pelvis W Contrast  Result Date: 07/17/2017 CLINICAL DATA:  Initial evaluation for acute abdominal pain. EXAM: CT ABDOMEN AND PELVIS WITH CONTRAST TECHNIQUE: Multidetector CT imaging of the abdomen and pelvis was performed using the standard protocol following bolus administration of intravenous contrast. CONTRAST:  100mL ISOVUE-300 IOPAMIDOL (ISOVUE-300) INJECTION 61% COMPARISON:  None. FINDINGS: Lower chest: Visualized lung bases are clear. Hepatobiliary: Liver within normal limits. Gallbladder normal. No biliary dilatation. Pancreas: Pancreas within normal limits. Spleen: Spleen within normal limits. Adrenals/Urinary Tract: The adrenal glands are normal. Kidneys equal size with symmetric enhancement. No nephrolithiasis, hydronephrosis, or focal enhancing renal mass. No hydroureter. Bladder within normal limits. Stomach/Bowel: Stomach within normal limits. No evidence for bowel obstruction. Tubular structure extending medially from the cecum favored to reflect the appendix measures up to 13 mm (series 2, image 52). Adjacent hazy inflammatory stranding, suspicious for acute appendicitis. Relative sparing of the base of the appendix. No evidence for perforation or other complication. No other acute inflammatory changes seen about the bowels. Vascular/Lymphatic: Normal intravascular enhancement seen throughout the intra-abdominal aorta and its branch vessels. No pathologically enlarged intra-abdominal or pelvic lymph nodes. Reproductive: Uterus and ovaries within normal limits for age. Other: No free intraperitoneal air.  No significant free fluid. Musculoskeletal: No acute osseus abnormality. No worrisome lytic or  blastic osseous lesions. IMPRESSION: Findings consistent with acute appendicitis. No evidence for perforation or other complication. Electronically Signed   By: Rise MuBenjamin  McClintock M.D.   On: 07/17/2017 03:28    Procedures Dr. Ovidio Kinavid Newman (07/17/17) - Laparoscopic Appendectomy  Hospital Course:  21 year old female who presented to [facility] with 24 hours of abdominal pain, workup significant for acute appendicitis (WBC 11.3, CT Above) and [facility]. She underwent the procedure above by Dr. Ovidio Kinavid Newman which she tolerated well. On postop day #1 the patient was afebrile, vitals stable, tolerating oral intake, mobilizing, pain controlled and stable for discharge home. She'll follow-up in our office in 2-3 weeks and knows to call with questions or concerns.  Physical Exam: General:  Alert, NAD, pleasant, comfortable Abd:  Soft, ND, appropriately tender, incisions C/D/I, +BS  Allergies as of 07/18/2017   No Known Allergies     Medication List    TAKE these medications   acetaminophen 500 MG tablet Commonly known as:  TYLENOL Take 1 tablet (500 mg total) by mouth every 8 (eight) hours as needed for mild pain or moderate pain. What changed:  how much to take  when to take this   HYDROcodone-acetaminophen 5-325 MG tablet Commonly known as:  NORCO/VICODIN Take 1-2 tablets by mouth every 4 (four) hours as needed for moderate pain.   ibuprofen 800 MG tablet Commonly known as:  ADVIL,MOTRIN Take 1 tablet (800 mg total) by mouth 3 (three) times daily.        Follow-up Information    [facility].   Specialty:  General Surgery Why:  call as soon as possible to confirm post-operative follow up appointment date/time. Contact information: [address] [place] [id number] [phone]          Signed: Hosie Spanglelizabeth Simaan, [facility] 07/18/2017, 2:17 PM  Pager: [phone] Consults: [phone] Mon-Fri 7:00 am-4:30 pm Sat-Sun 7:00 am-11:30 am

## 2017-07-18 NOTE — Progress Notes (Signed)
Pt tolerating diet with complaints of nausea, passing gas. D/C instructions and prescription was given.

## 2017-07-21 ENCOUNTER — Emergency Department (HOSPITAL_BASED_OUTPATIENT_CLINIC_OR_DEPARTMENT_OTHER)
Admission: EM | Admit: 2017-07-21 | Discharge: 2017-07-21 | Disposition: A | Discharge status: Home / Self Care | Payer: Self-pay | Attending: Emergency Medicine | Admitting: Emergency Medicine

## 2017-07-21 DIAGNOSIS — N898 Other specified noninflammatory disorders of vagina: Secondary | ICD-10-CM | POA: Insufficient documentation

## 2017-07-21 LAB — WET PREP, GENITAL
SPERM: NONE SEEN
Trich, Wet Prep: NONE SEEN
Yeast Wet Prep HPF POC: NONE SEEN

## 2017-07-21 MED ORDER — FLUCONAZOLE 100 MG PO TABS
150.0000 mg | ORAL_TABLET | Freq: Once | ORAL | Status: AC
  Administered 2017-07-21: 22:00:00 150 mg via ORAL
  Filled 2017-07-21: qty 1

## 2017-07-21 NOTE — Discharge Instructions (Signed)
Drink plenty of water. You can continue using monistat if that provides relief. Follow up with your OB/Gyn if symptoms persist. For your post-op abdominal pain, you can take benadryl with the hydrocodone as needed for itching. If you develop hives, swelling in your lips or tongue, or difficulty breathing, STOP taking it and report to ER.  You can also take 600mg  of advil/ibuprofen every 4 hours for pain. You can take tylenol/acetaminophen with this. Return to the ER for fever, or new or concerning symptoms.

## 2017-07-21 NOTE — ED Triage Notes (Signed)
Pt had appendectomy 8/5, discharged Monday. Reports woke up Tuesday with vaginal pain and labia swollen. Denies discharge, reports minor itching.

## 2017-07-21 NOTE — ED Provider Notes (Signed)
[facility]   CSN: [id number] Arrival date & time: 07/21/17  1723     History   Chief Complaint Chief Complaint  Patient presents with  . Vaginal Pain    HPI Whitney Gibson is a 21 y.o. female presenting with acute onset of vaginal swelling that began on Tuesday. States the outside of her vagina is swollen, though not painful. Reports intermittent vaginal itching and 1 occurrence of mild dysuria. LMP July 30. Denies vaginal bleeding or discharge. States she thought this may be a yeast infection and treated with monostat without relief. States she has a history of yeast infections after antibiotic use. Pt is s/p appendectomy on 07/17/17 without complications. States her wounds are healing well, abd pain is improving. Denies fevers, purulent drainage or erythema of wounds. Per chart review, urine preg negative on 07/17/17.   The history is provided by the patient.    No past medical history on file.  Patient Active Problem List   Diagnosis Date Noted  . Acute appendicitis 07/17/2017    Past Surgical History:  Procedure Laterality Date  . LAPAROSCOPIC APPENDECTOMY N/A 07/17/2017   Procedure: APPENDECTOMY LAPAROSCOPIC;  Surgeon: Ovidio KinNewman, David, MD;  Location: [facility];  Service: General;  Laterality: N/A;    OB History    No data available       Home Medications    Prior to Admission medications   Medication Sig Start Date End Date Taking? Authorizing Provider  acetaminophen (TYLENOL) 500 MG tablet Take 1 tablet (500 mg total) by mouth every 8 (eight) hours as needed for mild pain or moderate pain. 07/18/17   Adam PhenixSimaan, Elizabeth S, PA-C  HYDROcodone-acetaminophen (NORCO/VICODIN) 5-325 MG tablet Take 1-2 tablets by mouth every 4 (four) hours as needed for moderate pain. 07/18/17   Adam PhenixSimaan, Elizabeth S, PA-C  ibuprofen (ADVIL,MOTRIN) 800 MG tablet Take 1 tablet (800 mg total) by mouth 3 (three) times daily. Patient not taking: Reported on 07/17/2017 03/13/17   Garlon HatchetSanders,  Lisa M, PA-C    Family History No family history on file.  Social History Social History  Substance Use Topics  . Smoking status: Never Smoker  . Smokeless tobacco: Never Used  . Alcohol use No     Allergies   Patient has no known allergies.   Review of Systems Review of Systems  Constitutional: Negative for chills and fever.  Genitourinary: Negative for frequency, pelvic pain, vaginal bleeding, vaginal discharge and vaginal pain.       Vaginal swelling  Musculoskeletal: Negative for back pain.     Physical Exam Updated Vital Signs BP (!) 136/92 (BP Location: Right Arm)   Pulse 85   Temp 98.9 F (37.2 C) (Oral)   Resp 16   LMP 07/16/2017   SpO2 99%   Physical Exam  Constitutional: She appears well-developed and well-nourished. No distress.  HENT:  Head: Normocephalic and atraumatic.  Eyes: Conjunctivae are normal.  Cardiovascular: Normal rate.   Pulmonary/Chest: Effort normal.  Abdominal: Soft.  Surgical incisions healing well, dermabond still in place. No purulence or erythema.   Genitourinary: Uterus normal. Cervix exhibits no motion tenderness, no discharge and no friability. Right adnexum displays no tenderness and no fullness. Left adnexum displays no tenderness and no fullness. There is erythema and tenderness (around introitus) in the vagina. No bleeding in the vagina. Vaginal discharge (white) found.  Genitourinary Comments: Exam performed with chaperone present. External labia without tenderness, edema, or lesions. Internal labia is erythematous, slightly edematous and tender.  Psychiatric: She has a normal mood and affect. Her behavior is normal.  Nursing note and vitals reviewed.    ED Treatments / Results  Labs (all labs ordered are listed, but only abnormal results are displayed) Labs Reviewed  WET PREP, GENITAL - Abnormal; Notable for the following:       Result Value   Clue Cells Wet Prep HPF POC PRESENT (*)    WBC, Wet Prep HPF POC FEW (*)     All other components within normal limits  GC/CHLAMYDIA PROBE AMP ([place]) NOT AT [facility]    EKG  EKG Interpretation None       Radiology No results found.  Procedures Procedures (including critical care time)  Medications Ordered in ED Medications  fluconazole (DIFLUCAN) tablet 150 mg (150 mg Oral Given 07/21/17 2138)     Initial Impression / Assessment and Plan / ED Course  I have reviewed the triage vital signs and the nursing notes.  Pertinent labs & imaging results that were available during my care of the patient were reviewed by me and considered in my medical decision making (see chart for details).     Pt presenting with vaginal edema and irritation. Pt has hx of candidiasis after antibiotic use. (Pt is s/p appendectomy on 07/17/17, with pre-op abx. Incisions are healing well.) Internal labia with erythema, tenderness and mild edema. No CMT or adnexal tenderness. Treated empirically for yeast. Advised pt to follow up with Gyn if symptoms persist. Pt is safe for discharge  Patient discussed with Dr. Madilyn Hook.  Discussed results, findings, treatment and follow up. Patient advised of return precautions. Patient verbalized understanding and agreed with plan.   Final Clinical Impressions(s) / ED Diagnoses   Final diagnoses:  Vaginal irritation    New Prescriptions New Prescriptions   No medications on file     Russo, [place] N, PA-C 07/21/17 2149    Tilden Fossa, MD 07/22/17 [phone]

## 2017-07-25 LAB — GC/CHLAMYDIA PROBE AMP (~~LOC~~) NOT AT ARMC
Chlamydia: NEGATIVE
Neisseria Gonorrhea: NEGATIVE

## 2017-08-15 ENCOUNTER — Encounter (HOSPITAL_BASED_OUTPATIENT_CLINIC_OR_DEPARTMENT_OTHER): Payer: Self-pay | Admitting: *Deleted

## 2017-08-15 ENCOUNTER — Emergency Department (HOSPITAL_BASED_OUTPATIENT_CLINIC_OR_DEPARTMENT_OTHER)
Admission: EM | Admit: 2017-08-15 | Discharge: 2017-08-15 | Disposition: A | Discharge status: Home / Self Care | Payer: Self-pay | Attending: Emergency Medicine | Admitting: Emergency Medicine

## 2017-08-15 DIAGNOSIS — IMO0001 Reserved for inherently not codable concepts without codable children: Secondary | ICD-10-CM

## 2017-08-15 DIAGNOSIS — T814XXA Infection following a procedure, initial encounter: Secondary | ICD-10-CM | POA: Insufficient documentation

## 2017-08-15 DIAGNOSIS — Y628 Failure of sterile precautions during other surgical and medical care: Secondary | ICD-10-CM | POA: Insufficient documentation

## 2017-08-15 MED ORDER — SULFAMETHOXAZOLE-TRIMETHOPRIM 800-160 MG PO TABS: 1.0000 | ORAL_TABLET | Freq: Two times a day (BID) | ORAL | 0 refills | Status: AC

## 2017-08-15 MED ORDER — CEPHALEXIN 500 MG PO CAPS: 500.0000 mg | ORAL_CAPSULE | Freq: Four times a day (QID) | ORAL | 0 refills | Status: DC

## 2017-08-15 MED ORDER — BACITRACIN ZINC 500 UNIT/GM EX OINT: 1.0000 "application " | TOPICAL_OINTMENT | Freq: Two times a day (BID) | CUTANEOUS | 0 refills | Status: AC

## 2017-08-15 MED ORDER — SULFAMETHOXAZOLE-TRIMETHOPRIM 800-160 MG PO TABS
1.0000 | ORAL_TABLET | Freq: Once | ORAL | Status: AC
  Administered 2017-08-15: 1 via ORAL
  Filled 2017-08-15: qty 1

## 2017-08-15 MED ORDER — CEPHALEXIN 250 MG PO CAPS
500.0000 mg | ORAL_CAPSULE | Freq: Once | ORAL | Status: AC
  Administered 2017-08-15: 500 mg via ORAL
  Filled 2017-08-15: qty 2

## 2017-08-15 NOTE — ED Triage Notes (Signed)
Pt c/o drainage from incision site post appendectomy x 1 month ago

## 2017-08-15 NOTE — ED Provider Notes (Signed)
[facility]   CSN: [id number] Arrival date & time: 08/15/17  1726     History   Chief Complaint Chief Complaint  Patient presents with  . Wound Check    HPI Whitney Gibson is a 21 y.o. female.  HPI  20 year old female presents with concern for a wound infection. She had laparoscopic appendectomy on 8/5. She states she's been doing well since then. 2 days ago she started having itching over her umbilicus wound. Now is noticing some redness. Her mom saw green discharge one time today. No fevers. Has had some mild pain. Has not seen her surgeon in follow-up. No vomiting.  History reviewed. No pertinent past medical history.  Patient Active Problem List   Diagnosis Date Noted  . Acute appendicitis 07/17/2017    Past Surgical History:  Procedure Laterality Date  . LAPAROSCOPIC APPENDECTOMY N/A 07/17/2017   Procedure: APPENDECTOMY LAPAROSCOPIC;  Surgeon: Ovidio Kin, MD;  Location: [facility];  Service: General;  Laterality: N/A;    OB History    No data available       Home Medications    Prior to Admission medications   Medication Sig Start Date End Date Taking? Authorizing Provider  acetaminophen (TYLENOL) 500 MG tablet Take 1 tablet (500 mg total) by mouth every 8 (eight) hours as needed for mild pain or moderate pain. 07/18/17   Adam Phenix, PA-C  bacitracin ointment Apply 1 application topically 2 (two) times daily. 08/15/17   Pricilla Loveless, MD  cephALEXin (KEFLEX) 500 MG capsule Take 1 capsule (500 mg total) by mouth 4 (four) times daily. 08/15/17   Pricilla Loveless, MD  ibuprofen (ADVIL,MOTRIN) 800 MG tablet Take 1 tablet (800 mg total) by mouth 3 (three) times daily. Patient not taking: Reported on 07/17/2017 03/13/17   Garlon Hatchet, PA-C  sulfamethoxazole-trimethoprim (BACTRIM DS,SEPTRA DS) 800-160 MG tablet Take 1 tablet by mouth 2 (two) times daily. 08/15/17 08/22/17  Pricilla Loveless, MD    Family History History reviewed. No pertinent  family history.  Social History Social History  Substance Use Topics  . Smoking status: Never Smoker  . Smokeless tobacco: Never Used  . Alcohol use No     Allergies   Patient has no known allergies.   Review of Systems Review of Systems  Constitutional: Negative for fever.  Gastrointestinal: Negative for vomiting.  Skin: Positive for color change and wound.  All other systems reviewed and are negative.    Physical Exam Updated Vital Signs BP 124/81 (BP Location: Right Arm)   Pulse (!) 110   Temp 98.2 F (36.8 C) (Oral)   Resp 18   Ht 5\' 2"  (1.575 m)   Wt 76.2 kg (168 lb)   LMP 07/16/2017   SpO2 99%   BMI 30.73 kg/m   Physical Exam  Constitutional: She is oriented to person, place, and time. She appears well-developed and well-nourished. No distress.  HENT:  Head: Normocephalic and atraumatic.  Right Ear: External ear normal.  Left Ear: External ear normal.  Nose: Nose normal.  Eyes: Right eye exhibits no discharge. Left eye exhibits no discharge.  Cardiovascular: Normal rate, regular rhythm and normal heart sounds.   Pulmonary/Chest: Effort normal and breath sounds normal.  Abdominal: Soft. There is tenderness in the periumbilical area.    Neurological: She is alert and oriented to person, place, and time.  Skin: Skin is warm and dry. She is not diaphoretic.  Nursing note and vitals reviewed.      ED  Treatments / Results  Labs (all labs ordered are listed, but only abnormal results are displayed) Labs Reviewed - No data to display  EKG  EKG Interpretation None       Radiology No results found.  Procedures Procedures (including critical care time)  Medications Ordered in ED Medications  cephALEXin (KEFLEX) capsule 500 mg (500 mg Oral Given 08/15/17 1847)  sulfamethoxazole-trimethoprim (BACTRIM DS,SEPTRA DS) 800-160 MG per tablet 1 tablet (1 tablet Oral Given 08/15/17 1847)     Initial Impression / Assessment and Plan / ED Course  I have  reviewed the triage vital signs and the nursing notes.  Pertinent labs & imaging results that were available during my care of the patient were reviewed by me and considered in my medical decision making (see chart for details).     Patient appears to have postoperative cellulitis. Given some concern for purulence, I will place her on Bactrim in addition to Keflex. Bacitracin as well. She otherwise appears well does not appear to have systemic signs of infection. She also does not have the deep abdominal tenderness or pain. My suspicion for a intra-abdominal abscess is low. Follow-up with her surgeon in 2 days for wound recheck. Discussed return precautions.  Final Clinical Impressions(s) / ED Diagnoses   Final diagnoses:  Postoperative cellulitis of surgical wound, initial encounter    New Prescriptions Discharge Medication List as of 08/15/2017  6:34 PM    START taking these medications   Details  bacitracin ointment Apply 1 application topically 2 (two) times daily., Starting Mon 08/15/2017, Print    cephALEXin (KEFLEX) 500 MG capsule Take 1 capsule (500 mg total) by mouth 4 (four) times daily., Starting Mon 08/15/2017, Print    sulfamethoxazole-trimethoprim (BACTRIM DS,SEPTRA DS) 800-160 MG tablet Take 1 tablet by mouth 2 (two) times daily., Starting Mon 08/15/2017, Until Mon 08/22/2017, Print         Pricilla LovelessGoldston, Amy Belloso, MD 08/15/17 [phone]

## 2020-02-03 ENCOUNTER — Encounter (HOSPITAL_BASED_OUTPATIENT_CLINIC_OR_DEPARTMENT_OTHER): Payer: Self-pay | Admitting: Emergency Medicine

## 2020-02-03 ENCOUNTER — Other Ambulatory Visit: Payer: Self-pay

## 2020-02-03 ENCOUNTER — Emergency Department (HOSPITAL_BASED_OUTPATIENT_CLINIC_OR_DEPARTMENT_OTHER)
Admission: EM | Admit: 2020-02-03 | Discharge: 2020-02-03 | Disposition: A | Discharge status: Home / Self Care | Payer: Managed Care, Other (non HMO) | Attending: Emergency Medicine | Admitting: Emergency Medicine

## 2020-02-03 DIAGNOSIS — N898 Other specified noninflammatory disorders of vagina: Secondary | ICD-10-CM | POA: Diagnosis not present

## 2020-02-03 DIAGNOSIS — Z79899 Other long term (current) drug therapy: Secondary | ICD-10-CM | POA: Insufficient documentation

## 2020-02-03 LAB — WET PREP, GENITAL
Clue Cells Wet Prep HPF POC: NONE SEEN
Sperm: NONE SEEN
Trich, Wet Prep: NONE SEEN
Yeast Wet Prep HPF POC: NONE SEEN

## 2020-02-03 LAB — PREGNANCY, URINE: Preg Test, Ur: NEGATIVE

## 2020-02-03 NOTE — ED Triage Notes (Signed)
Reports vaginal discomfort for the past three days.  Endorses discharge and itching.  Took monistat otc for yeast with no relief.  Reports getting the stronger monistat 1 last night. Woke up during the night with vaginal burning.

## 2020-02-03 NOTE — ED Provider Notes (Signed)
[facility] EMERGENCY DEPARTMENT Provider Note   CSN: [id number] Arrival date & time: 02/03/20  0240     History Chief Complaint  Patient presents with  . Vaginal Pain    Whitney Gibson is a 24 y.o. female.  The history is provided by the patient.  Vaginal Pain This is a new problem. The current episode started more than 2 days ago. The problem occurs constantly. The problem has been gradually worsening. Pertinent negatives include no chest pain, no abdominal pain, no headaches and no shortness of breath. Nothing aggravates the symptoms. Nothing relieves the symptoms. Treatments tried: 2 yeast kits. The treatment provided no relief.  thought she had a yeast infection and used a 3 day then a one day treatment and now feels more irritated.      History reviewed. No pertinent past medical history.  Patient Active Problem List   Diagnosis Date Noted  . Acute appendicitis 07/17/2017    Past Surgical History:  Procedure Laterality Date  . LAPAROSCOPIC APPENDECTOMY N/A 07/17/2017   Procedure: APPENDECTOMY LAPAROSCOPIC;  Surgeon: Ovidio Kin, MD;  Location: [facility];  Service: General;  Laterality: N/A;     OB History   No obstetric history on file.     History reviewed. No pertinent family history.  Social History   Tobacco Use  . Smoking status: Never Smoker  . Smokeless tobacco: Never Used  Substance Use Topics  . Alcohol use: No  . Drug use: No    Home Medications Prior to Admission medications   Medication Sig Start Date End Date Taking? Authorizing Provider  acetaminophen (TYLENOL) 500 MG tablet Take 1 tablet (500 mg total) by mouth every 8 (eight) hours as needed for mild pain or moderate pain. 07/18/17   Adam Phenix, PA-C  bacitracin ointment Apply 1 application topically 2 (two) times daily. 08/15/17   Pricilla Loveless, MD  cephALEXin (KEFLEX) 500 MG capsule Take 1 capsule (500 mg total) by mouth 4 (four) times daily. 08/15/17   Pricilla Loveless, MD    ibuprofen (ADVIL,MOTRIN) 800 MG tablet Take 1 tablet (800 mg total) by mouth 3 (three) times daily. Patient not taking: Reported on 07/17/2017 03/13/17   Garlon Hatchet, PA-C    Allergies    Patient has no known allergies.  Review of Systems   Review of Systems  Constitutional: Negative for fever.  HENT: Negative for congestion.   Eyes: Negative for visual disturbance.  Respiratory: Negative for shortness of breath.   Cardiovascular: Negative for chest pain.  Gastrointestinal: Negative for abdominal pain.  Genitourinary: Positive for vaginal pain. Negative for genital sores, menstrual problem and vaginal bleeding.  Musculoskeletal: Negative for arthralgias.  Skin: Negative for rash.  Neurological: Negative for headaches.  Psychiatric/Behavioral: Negative for agitation.  All other systems reviewed and are negative.   Physical Exam Updated Vital Signs BP (!) 142/86 (BP Location: Right Arm)   Pulse 97   Temp 98.1 F (36.7 C) (Oral)   Resp 18   Ht 5\' 2"  (1.575 m)   Wt 77.1 kg   SpO2 100%   BMI 31.09 kg/m   Physical Exam Vitals and nursing note reviewed. Exam conducted with a chaperone present.  Constitutional:      General: She is not in acute distress.    Appearance: Normal appearance.  HENT:     Head: Normocephalic and atraumatic.     Nose: Nose normal.  Eyes:     Conjunctiva/sclera: Conjunctivae normal.     Pupils: Pupils are  equal, round, and reactive to light.  Cardiovascular:     Rate and Rhythm: Normal rate and regular rhythm.     Pulses: Normal pulses.     Heart sounds: Normal heart sounds.  Pulmonary:     Effort: Pulmonary effort is normal.     Breath sounds: Normal breath sounds.  Abdominal:     General: Abdomen is flat. Bowel sounds are normal.     Tenderness: There is no abdominal tenderness. There is no guarding or rebound.  Genitourinary:    General: Normal vulva.     Exam position: Lithotomy position.     Cervix: Normal.     Adnexa: Right adnexa  normal and left adnexa normal.     Comments: Medication in the vaginal vault Musculoskeletal:        General: Normal range of motion.     Cervical back: Normal range of motion and neck supple.  Skin:    General: Skin is warm and dry.     Capillary Refill: Capillary refill takes less than 2 seconds.  Neurological:     General: No focal deficit present.     Mental Status: She is alert and oriented to person, place, and time.  Psychiatric:        Mood and Affect: Mood normal.        Behavior: Behavior normal.     ED Results / Procedures / Treatments   Labs (all labs ordered are listed, but only abnormal results are displayed) Labs Reviewed  WET PREP, GENITAL  PREGNANCY, URINE  GC/CHLAMYDIA PROBE AMP ([place]) NOT AT [facility]    EKG None  Radiology No results found.  Procedures Procedures (including critical care time)  Medications Ordered in ED Medications - No data to display  ED Course  I have reviewed the triage vital signs and the nursing notes.  Pertinent labs & imaging results that were available during my care of the patient were reviewed by me and considered in my medical decision making (see chart for details).    Whitney Gibson was evaluated in Emergency Department on 02/03/2020 for the symptoms described in the history of present illness. She was evaluated in the context of the global COVID-19 pandemic, which necessitated consideration that the patient might be at risk for infection with the SARS-CoV-2 virus that causes COVID-19. Institutional protocols and algorithms that pertain to the evaluation of patients at risk for COVID-19 are in a state of rapid change based on information released by regulatory bodies including the CDC and federal and state organizations. These policies and algorithms were followed during the patient's care in the ED.  Final Clinical Impression(s) / ED Diagnoses Return for weakness, numbness, changes in vision or speech, fevers >100.4  unrelieved by medication, shortness of breath, intractable vomiting, or diarrhea, abdominal pain, Inability to tolerate liquids or food, cough, altered mental status or any concerns. No signs of systemic illness or infection. The patient is nontoxic-appearing on exam and vital signs are within normal limits.   I have reviewed the triage vital signs and the nursing notes. Pertinent labs &imaging results that were available during my care of the patient were reviewed by me and considered in my medical decision making (see chart for details).  After history, exam, and medical workup I feel the patient has been appropriately medically screened and is safe for discharge home. Pertinent diagnoses were discussed with the patient. Patient was given return precautions   Brodey Bonn, MD 02/03/20 [id number]

## 2020-02-05 LAB — GC/CHLAMYDIA PROBE AMP (~~LOC~~) NOT AT ARMC
Chlamydia: NEGATIVE
Neisseria Gonorrhea: NEGATIVE

## 2020-10-12 ENCOUNTER — Encounter (HOSPITAL_BASED_OUTPATIENT_CLINIC_OR_DEPARTMENT_OTHER): Payer: Self-pay | Admitting: Emergency Medicine

## 2020-10-12 ENCOUNTER — Other Ambulatory Visit: Payer: Self-pay

## 2020-10-12 ENCOUNTER — Emergency Department (HOSPITAL_BASED_OUTPATIENT_CLINIC_OR_DEPARTMENT_OTHER)
Admission: EM | Admit: 2020-10-12 | Discharge: 2020-10-12 | Disposition: A | Discharge status: Home / Self Care | Payer: Managed Care, Other (non HMO) | Attending: Emergency Medicine | Admitting: Emergency Medicine

## 2020-10-12 DIAGNOSIS — H1032 Unspecified acute conjunctivitis, left eye: Secondary | ICD-10-CM | POA: Insufficient documentation

## 2020-10-12 DIAGNOSIS — H109 Unspecified conjunctivitis: Secondary | ICD-10-CM

## 2020-10-12 MED ORDER — TETRACAINE HCL 0.5 % OP SOLN
1.0000 [drp] | Freq: Once | OPHTHALMIC | Status: AC
  Administered 2020-10-12: 1 [drp] via OPHTHALMIC
  Filled 2020-10-12: qty 4

## 2020-10-12 MED ORDER — ERYTHROMYCIN 5 MG/GM OP OINT
TOPICAL_OINTMENT | Freq: Once | OPHTHALMIC | Status: AC
  Filled 2020-10-12: qty 3.5

## 2020-10-12 MED ORDER — FLUORESCEIN SODIUM 1 MG OP STRP
1.0000 | ORAL_STRIP | Freq: Once | OPHTHALMIC | Status: AC
  Administered 2020-10-12: 1 via OPHTHALMIC
  Filled 2020-10-12: qty 1

## 2020-10-12 NOTE — Discharge Instructions (Signed)
Please read and follow all provided instructions.  Your diagnoses today include:  1. Conjunctivitis of left eye, unspecified conjunctivitis type     Tests performed today include:  Visual acuity testing to check your vision  Fluorescein dye examination to look for scratches on your eye  Vital signs. See below for your results today.   Medications prescribed:   Erythromycin  - antibiotic eye ointment  Use this medication as follows:  Apply 1/4" of the antibiotic ointment to affected eye up to 6 times a day while awake for 7 days  Take any prescribed medications only as directed.  Home care instructions:  Follow any educational materials contained in this packet. If you wear contact lenses, do not use them until your eye caregiver approves. Follow-up care is necessary to be sure the infection is healing if not completely resolved in 2-3 days. See your caregiver or eye specialist as suggested for followup.   If you have an eye infection, wash your hands often as this is very contagious and is easily spread from person to person.   Follow-up instructions: Please follow-up with your primary care doctor OR the opthalmologist listed in the next 2-3 days for further evaluation of your symptoms if not getting better.   Return instructions:   Please return to the Emergency Department if you experience worsening symptoms.   Please return immediately if you develop severe pain, pus drainage, new change in vision, or fever.  Please return if you have any other emergent concerns.  Additional Information:  Your vital signs today were: BP 131/76 (BP Location: Left Arm)   Pulse 79   Temp 98.4 F (36.9 C) (Oral)   Resp 16   Ht 5\' 2"  (1.575 m)   Wt 74.4 kg   LMP 10/06/2020   SpO2 100%   BMI 30.00 kg/m  If your blood pressure (BP) was elevated above 135/85 this visit, please have this repeated by your doctor within one month. ---------------

## 2020-10-12 NOTE — ED Triage Notes (Signed)
She had her eyelashes done on Friday. Has had pain and swelling to L eye since then. Also reports blurred vision.

## 2020-10-12 NOTE — ED Provider Notes (Signed)
[facility] EMERGENCY DEPARTMENT Provider Note   CSN: [id number] Arrival date & time: 10/12/20  0940     History Chief Complaint  Patient presents with   Eye Problem    Whitney Gibson is a 24 y.o. female.  Patient presents to the emergency department for evaluation of left eye irritation, matting and tearing starting 2 days ago.  Prior to symptoms, patient states that she had her eyelashes done.  This involves glue.  She denies knowingly having anything in her eye.  She has had blurry vision and tearing.  This morning the eye was red and matted shut.  She feels a gritty sensation in her eye.  No fevers, colds, sick contacts.  She thought that she had an eyelash in her eye.  She has been irrigating with eyedrops without much improvement.        History reviewed. No pertinent past medical history.  Patient Active Problem List   Diagnosis Date Noted   Acute appendicitis 07/17/2017    Past Surgical History:  Procedure Laterality Date   LAPAROSCOPIC APPENDECTOMY N/A 07/17/2017   Procedure: APPENDECTOMY LAPAROSCOPIC;  Surgeon: Ovidio Kin, MD;  Location: [facility];  Service: General;  Laterality: N/A;     OB History   No obstetric history on file.     No family history on file.  Social History   Tobacco Use   Smoking status: Never Smoker   Smokeless tobacco: Never Used  Substance Use Topics   Alcohol use: No   Drug use: No    Home Medications Prior to Admission medications   Medication Sig Start Date End Date Taking? Authorizing Provider  acetaminophen (TYLENOL) 500 MG tablet Take 1 tablet (500 mg total) by mouth every 8 (eight) hours as needed for mild pain or moderate pain. 07/18/17   Adam Phenix, PA-C  bacitracin ointment Apply 1 application topically 2 (two) times daily. 08/15/17   Pricilla Loveless, MD  cephALEXin (KEFLEX) 500 MG capsule Take 1 capsule (500 mg total) by mouth 4 (four) times daily. 08/15/17   Pricilla Loveless, MD  ibuprofen  (ADVIL,MOTRIN) 800 MG tablet Take 1 tablet (800 mg total) by mouth 3 (three) times daily. Patient not taking: Reported on 07/17/2017 03/13/17   Garlon Hatchet, PA-C    Allergies    Patient has no known allergies.  Review of Systems   Review of Systems  Constitutional: Negative for chills and fever.  HENT: Negative for congestion and rhinorrhea.   Eyes: Positive for pain (Irritation), discharge (Tearing), redness and visual disturbance. Negative for photophobia.  Respiratory: Negative for cough and shortness of breath.   Gastrointestinal: Negative for nausea and vomiting.    Physical Exam Updated Vital Signs BP 131/76 (BP Location: Left Arm)    Pulse 79    Temp 98.4 F (36.9 C) (Oral)    Resp 16    Ht 5\' 2"  (1.575 m)    Wt 74.4 kg    LMP 10/06/2020    SpO2 100%    BMI 30.00 kg/m   Physical Exam Vitals and nursing note reviewed.  Constitutional:      Appearance: She is well-developed.  HENT:     Head: Normocephalic and atraumatic.  Eyes:     General: Lids are normal. Lids are everted, no foreign bodies appreciated. Vision grossly intact.     Extraocular Movements:     Right eye: Normal extraocular motion.     Left eye: Normal extraocular motion.     Conjunctiva/sclera:  Right eye: Right conjunctiva is not injected. No chemosis, exudate or hemorrhage.    Left eye: Left conjunctiva is injected. Chemosis (Mild lateral) present. No exudate or hemorrhage.    Pupils:     Right eye: Pupil is round, reactive and not sluggish.     Left eye: Pupil is round, reactive and not sluggish. No corneal abrasion or fluorescein uptake.  Pulmonary:     Effort: No respiratory distress.  Musculoskeletal:     Cervical back: Normal range of motion and neck supple.  Skin:    General: Skin is warm and dry.  Neurological:     Mental Status: She is alert.     ED Results / Procedures / Treatments   Labs (all labs ordered are listed, but only abnormal results are displayed) Labs Reviewed - No  data to display  EKG None  Radiology No results found.  Procedures Procedures (including critical care time)  Medications Ordered in ED Medications  erythromycin ophthalmic ointment (has no administration in time range)  fluorescein ophthalmic strip 1 strip (1 strip Left Eye Given by Other 10/12/20 1036)  tetracaine (PONTOCAINE) 0.5 % ophthalmic solution 1 drop (1 drop Left Eye Given by Other 10/12/20 1036)    ED Course  I have reviewed the triage vital signs and the nursing notes.  Pertinent labs & imaging results that were available during my care of the patient were reviewed by me and considered in my medical decision making (see chart for details).  Patient seen and examined. Work-up initiated. Medications ordered.   Vital signs reviewed and are as follows: BP 131/76 (BP Location: Left Arm)    Pulse 79    Temp 98.4 F (36.9 C) (Oral)    Resp 16    Ht 5\' 2"  (1.575 m)    Wt 74.4 kg    LMP 10/06/2020    SpO2 100%    BMI 30.00 kg/m   Two drops of tetracaine instilled into affected eye.   Fluorescein strip applied to affected eye. Wood's lamp used to assess for corneal abrasion. No corneal abrasion identified. No foreign bodies noted, including with lid eversion. No visible hyphema.   Patient tolerated procedure well without immediate complication.   Patient with conjunctivitis, possibly chemical.  Will prescribe erythromycin ointment for lubrication and to cover for infection.  Patient encouraged to use ibuprofen or Tylenol for pain.  Encouraged PCP or ophthalmologic follow-up in 2 days if not improving.  Referral given.    Visual Acuity  Right Eye Distance: 20/30 -1 Left Eye Distance: (S) 20/70 Bilateral Distance: 20/40  Right Eye Near:   Left Eye Near:    Bilateral Near:         MDM Rules/Calculators/A&P                          Patient with conjunctivitis after having her eyelashes done.  No corneal abrasion or foreign body noted.  Treatment as  above.   Final Clinical Impression(s) / ED Diagnoses Final diagnoses:  Conjunctivitis of left eye, unspecified conjunctivitis type    Rx / DC Orders ED Discharge Orders    None       10/08/2020, PA-C 10/12/20 1045    10/14/20, MD 10/13/20 [phone]

## 2021-08-01 ENCOUNTER — Other Ambulatory Visit: Payer: Self-pay

## 2021-08-01 ENCOUNTER — Encounter (HOSPITAL_BASED_OUTPATIENT_CLINIC_OR_DEPARTMENT_OTHER): Payer: Self-pay | Admitting: *Deleted

## 2021-08-01 ENCOUNTER — Emergency Department (HOSPITAL_BASED_OUTPATIENT_CLINIC_OR_DEPARTMENT_OTHER): Payer: BC Managed Care – PPO

## 2021-08-01 ENCOUNTER — Emergency Department (HOSPITAL_BASED_OUTPATIENT_CLINIC_OR_DEPARTMENT_OTHER)
Admission: EM | Admit: 2021-08-01 | Discharge: 2021-08-01 | Disposition: A | Discharge status: Home / Self Care | Payer: BC Managed Care – PPO | Attending: Emergency Medicine | Admitting: Emergency Medicine

## 2021-08-01 DIAGNOSIS — R109 Unspecified abdominal pain: Secondary | ICD-10-CM

## 2021-08-01 DIAGNOSIS — O26891 Other specified pregnancy related conditions, first trimester: Secondary | ICD-10-CM | POA: Insufficient documentation

## 2021-08-01 DIAGNOSIS — R1031 Right lower quadrant pain: Secondary | ICD-10-CM | POA: Diagnosis not present

## 2021-08-01 DIAGNOSIS — M545 Low back pain, unspecified: Secondary | ICD-10-CM | POA: Diagnosis not present

## 2021-08-01 DIAGNOSIS — Z3A13 13 weeks gestation of pregnancy: Secondary | ICD-10-CM | POA: Insufficient documentation

## 2021-08-01 DIAGNOSIS — R300 Dysuria: Secondary | ICD-10-CM | POA: Diagnosis not present

## 2021-08-01 DIAGNOSIS — R1032 Left lower quadrant pain: Secondary | ICD-10-CM | POA: Diagnosis not present

## 2021-08-01 LAB — URINALYSIS, ROUTINE W REFLEX MICROSCOPIC
Bilirubin Urine: NEGATIVE
Glucose, UA: NEGATIVE mg/dL
Hgb urine dipstick: NEGATIVE
Ketones, ur: NEGATIVE mg/dL
Nitrite: NEGATIVE
Protein, ur: NEGATIVE mg/dL
Specific Gravity, Urine: 1.015 (ref 1.005–1.030)
pH: 7.5 (ref 5.0–8.0)

## 2021-08-01 LAB — COMPREHENSIVE METABOLIC PANEL
ALT: 13 U/L (ref 0–44)
AST: 17 U/L (ref 15–41)
Albumin: 3.8 g/dL (ref 3.5–5.0)
Alkaline Phosphatase: 47 U/L (ref 38–126)
Anion gap: 8 (ref 5–15)
BUN: 6 mg/dL (ref 6–20)
CO2: 23 mmol/L (ref 22–32)
Calcium: 9.3 mg/dL (ref 8.9–10.3)
Chloride: 102 mmol/L (ref 98–111)
Creatinine, Ser: 0.54 mg/dL (ref 0.44–1.00)
GFR, Estimated: >60 mL/min (ref >60)
Glucose, Bld: 100 mg/dL — ABNORMAL HIGH (ref 70–99)
Potassium: 3.5 mmol/L (ref 3.5–5.1)
Sodium: 133 mmol/L — ABNORMAL LOW (ref 135–145)
Total Bilirubin: 0.3 mg/dL (ref 0.3–1.2)
Total Protein: 7.4 g/dL (ref 6.5–8.1)

## 2021-08-01 LAB — CBC WITH DIFFERENTIAL/PLATELET
Abs Immature Granulocytes: 0.04 10*3/uL (ref 0.00–0.07)
Basophils Absolute: 0 10*3/uL (ref 0.0–0.1)
Basophils Relative: 0 %
Eosinophils Absolute: 0.1 10*3/uL (ref 0.0–0.5)
Eosinophils Relative: 1 %
HCT: 38.3 % (ref 36.0–46.0)
Hemoglobin: 13.4 g/dL (ref 12.0–15.0)
Immature Granulocytes: 0 %
Lymphocytes Relative: 20 %
Lymphs Abs: 2 10*3/uL (ref 0.7–4.0)
MCH: 32.1 pg (ref 26.0–34.0)
MCHC: 35 g/dL (ref 30.0–36.0)
MCV: 91.6 fL (ref 80.0–100.0)
Monocytes Absolute: 0.6 10*3/uL (ref 0.1–1.0)
Monocytes Relative: 6 %
Neutro Abs: 7.2 10*3/uL (ref 1.7–7.7)
Neutrophils Relative %: 73 %
Platelets: 228 10*3/uL (ref 150–400)
RBC: 4.18 MIL/uL (ref 3.87–5.11)
RDW: 12.8 % (ref 11.5–15.5)
WBC: 10 10*3/uL (ref 4.0–10.5)
nRBC: 0 % (ref 0.0–0.2)

## 2021-08-01 LAB — URINALYSIS, MICROSCOPIC (REFLEX)

## 2021-08-01 LAB — LIPASE, BLOOD: Lipase: 22 U/L (ref 11–51)

## 2021-08-01 MED ORDER — SODIUM CHLORIDE 0.9 % IV SOLN
INTRAVENOUS | Status: DC | PRN
  Administered 2021-08-01: 500 mL via INTRAVENOUS

## 2021-08-01 MED ORDER — CEPHALEXIN 500 MG PO CAPS: 500.0000 mg | ORAL_CAPSULE | Freq: Four times a day (QID) | ORAL | 0 refills | Status: AC

## 2021-08-01 MED ORDER — SODIUM CHLORIDE 0.9 % IV SOLN
1.0000 g | Freq: Once | INTRAVENOUS | Status: AC
  Administered 2021-08-01: 1 g via INTRAVENOUS
  Filled 2021-08-01: qty 10

## 2021-08-01 NOTE — ED Triage Notes (Signed)
C/o burning sensation when voiding. Have been on ABX for a previous UTI (Macrobid). Back pain that radiates to suprapubic area.

## 2021-08-01 NOTE — Discharge Instructions (Addendum)
Ultrasound today was normal.  They are measuring need to be 12 weeks and 5 days.  Blood work was all normal.  Urine did show infection.  You are given a dose of IV antibiotics and a prescription was sent to the pharmacy to treat you for a possible kidney infection.  Take as prescribed.  If you have pain at home you can take Tylenol.  Avoid any anti-inflammatory medications such as Aleve, Motrin, ibuprofen or naproxen.  Follow-up with PCP or OB/GYN if you continue to have symptoms.

## 2021-08-01 NOTE — ED Provider Notes (Signed)
[facility] EMERGENCY DEPARTMENT Provider Note   CSN: [id number] Arrival date & time: 08/01/21  1051     History Chief Complaint  Patient presents with   Back Pain    Whitney Gibson is a 25 y.o. female G1P0 currently [redacted] weeks gestation presents to emergency department today with chief complaint of back pain x1 day.  Patient states she finished Macrobid yesterday for UTI and it was prescribed by her OB/GYN.  She states this morning she had dysuria and some pain in her lower back radiating to her pelvis.  She describes the pain as cramping and has been intermittent.  She rates the pain 4 out of 10 in severity.  No over-the-counter medications taken for symptoms prior to arrival.  She admits to IUP.  She denies any fall, injury or trauma to her back.  She denies any fever, chills, gross hematuria, vaginal discharge, abnormal vaginal bleeding. OB care through [facility]. Abdominal surgical history includes laparoscopic appendectomy.    No past medical history on file.  Patient Active Problem List   Diagnosis Date Noted   Acute appendicitis 07/17/2017    Past Surgical History:  Procedure Laterality Date   LAPAROSCOPIC APPENDECTOMY N/A 07/17/2017   Procedure: APPENDECTOMY LAPAROSCOPIC;  Surgeon: Ovidio Kin, MD;  Location: [facility];  Service: General;  Laterality: N/A;     OB History     Gravida  2   Para      Term      Preterm      AB      Living         SAB      IAB      Ectopic      Multiple      Live Births              No family history on file.  Social History   Tobacco Use   Smoking status: Never   Smokeless tobacco: Never  Vaping Use   Vaping Use: Never used  Substance Use Topics   Alcohol use: No   Drug use: No    Home Medications Prior to Admission medications   Medication Sig Start Date End Date Taking? Authorizing Provider  cephALEXin (KEFLEX) 500 MG capsule Take 1 capsule (500 mg total) by mouth 4 (four) times daily for 14 days.  08/01/21 08/15/21 Yes Walisiewicz, Tamaiya Bump E, PA-C  acetaminophen (TYLENOL) 500 MG tablet Take 1 tablet (500 mg total) by mouth every 8 (eight) hours as needed for mild pain or moderate pain. 07/18/17   Adam Phenix, PA-C  bacitracin ointment Apply 1 application topically 2 (two) times daily. 08/15/17   Pricilla Loveless, MD  ibuprofen (ADVIL,MOTRIN) 800 MG tablet Take 1 tablet (800 mg total) by mouth 3 (three) times daily. Patient not taking: Reported on 07/17/2017 03/13/17   Garlon Hatchet, PA-C    Allergies    Patient has no known allergies.  Review of Systems   Review of Systems All other systems are reviewed and are negative for acute change except as noted in the HPI.  Physical Exam Updated Vital Signs BP 110/72   Pulse 93   Temp 98.1 F (36.7 C) (Oral)   Resp 16   Ht 5\' 2"  (1.575 m)   Wt 82.2 kg   LMP 10/06/2020   SpO2 100%   BMI 33.14 kg/m   Physical Exam Vitals and nursing note reviewed.  Constitutional:      General: She is not in acute distress.  Appearance: She is not ill-appearing.  HENT:     Head: Normocephalic and atraumatic.     Right Ear: Tympanic membrane and external ear normal.     Left Ear: Tympanic membrane and external ear normal.     Nose: Nose normal.     Mouth/Throat:     Mouth: Mucous membranes are moist.     Pharynx: Oropharynx is clear.  Eyes:     General: No scleral icterus.       Right eye: No discharge.        Left eye: No discharge.     Extraocular Movements: Extraocular movements intact.     Conjunctiva/sclera: Conjunctivae normal.     Pupils: Pupils are equal, round, and reactive to light.  Neck:     Vascular: No JVD.  Cardiovascular:     Rate and Rhythm: Normal rate and regular rhythm.     Pulses: Normal pulses.          Radial pulses are 2+ on the right side and 2+ on the left side.     Heart sounds: Normal heart sounds.  Pulmonary:     Comments: Lungs clear to auscultation in all fields. Symmetric chest rise. No wheezing,  rales, or rhonchi. Abdominal:     Tenderness: There is right CVA tenderness. There is no left CVA tenderness.     Comments: Abdomen is soft, non-distended, tender to bilateral lower quadrants. No rigidity, no guarding. No peritoneal signs.  Musculoskeletal:        General: Normal range of motion.     Cervical back: Normal range of motion.  Skin:    General: Skin is warm and dry.     Capillary Refill: Capillary refill takes less than 2 seconds.  Neurological:     Mental Status: She is oriented to person, place, and time.     GCS: GCS eye subscore is 4. GCS verbal subscore is 5. GCS motor subscore is 6.     Comments: Fluent speech, no facial droop.  Psychiatric:        Behavior: Behavior normal.    ED Results / Procedures / Treatments   Labs (all labs ordered are listed, but only abnormal results are displayed) Labs Reviewed  URINALYSIS, ROUTINE W REFLEX MICROSCOPIC - Abnormal; Notable for the following components:      Result Value   Leukocytes,Ua SMALL (*)    All other components within normal limits  URINALYSIS, MICROSCOPIC (REFLEX) - Abnormal; Notable for the following components:   Bacteria, UA MANY (*)    All other components within normal limits  COMPREHENSIVE METABOLIC PANEL - Abnormal; Notable for the following components:   Sodium 133 (*)    Glucose, Bld 100 (*)    All other components within normal limits  CBC WITH DIFFERENTIAL/PLATELET  LIPASE, BLOOD    EKG None  Radiology US OB Comp Less 14 Wks  Result Date: 08/01/2021 CLINICAL DATA:  Patient finished antibiotics for UTI 2 days ago then started having low back pain and generalized pelvic cramps. Patient is [redacted] wks pregnant, had normal IUP on ultrasound at [facility] office 3 wks ago EXAM: OBSTETRIC <14 WK ULTRASOUND TECHNIQUE: Transabdominal ultrasound was performed for evaluation of the gestation as well as the maternal uterus and adnexal regions. COMPARISON:  None. FINDINGS: Intrauterine gestational sac: Single Yolk  sac:  Not Visualized. Embryo:  Visualized. Cardiac Activity: Visualized. Heart Rate: 158 bpm CRL:   62.4 mm   12 w 5 d  [place] EDC: 02/08/2022 Subchorionic hemorrhage:  None visualized. Maternal uterus/adnexae: Right ovary unremarkable. Left ovary not visualized. No adnexal masses. No uterine masses. No pelvic free fluid. IMPRESSION: 1. Single live intrauterine pregnancy with a measured gestational age of [redacted] weeks and 5 days. No pregnancy complication. Electronically Signed   By: Amie Portland M.D.   On: 08/01/2021 13:23    Procedures Procedures   Medications Ordered in ED Medications  0.9 %  sodium chloride infusion (500 mLs Intravenous New Bag/Given 08/01/21 1333)  cefTRIAXone (ROCEPHIN) 1 g in sodium chloride 0.9 % 100 mL IVPB (0 g Intravenous Stopped 08/01/21 1404)    ED Course  I have reviewed the triage vital signs and the nursing notes.  Pertinent labs & imaging results that were available during my care of the patient were reviewed by me and considered in my medical decision making (see chart for details).    MDM Rules/Calculators/A&P                           History provided by patient with additional history obtained from chart review.     [redacted] weeks pregnant, well appearing female presenting with dysuria and flank pain, recently finished macrobid for UTI. Afebrile, HDS here. Noted to be tachycardic in triage to 10-, HR in the 90s during my exam. She has right CVA tenderness as well as tenderness to bilateral lower quadrants without peritoneal signs. UA is concerning for infection with leukocytes, WBC and many bacteria.  CBC and CMP are overall unremarkable.  Lipase within normal range.  Patient given dose of Rocephin to cover for possible pyelonephritis given her CVA tenderness and dysuria.  Ultrasound obtained with complaint of cramping and shows IUP measuring 12 weeks and 5 days.  No complications seen.  Updated patient on all results.  She is agreeable with plan of  care.  Prescription sent to the pharmacy for Keflex.  Recommended follow-up with OB/GYN for recheck. Strict return precautions discussed.   Portions of this note were generated with Scientist, clinical (histocompatibility and immunogenetics). Dictation errors may occur despite best attempts at proofreading.  Final Clinical Impression(s) / ED Diagnoses Final diagnoses:  Abdominal cramping  Dysuria during pregnancy in first trimester    Rx / DC Orders ED Discharge Orders          Ordered    cephALEXin (KEFLEX) 500 MG capsule  4 times daily        08/01/21 1421             Shanon Ace, PA-C 08/01/21 1429    Melene Plan, DO 08/01/21 1453

## 2021-08-01 NOTE — ED Notes (Signed)
ED Provider at bedside. 

## 2021-08-01 NOTE — ED Notes (Signed)
Pt. In ultrasound

## 2022-06-23 IMAGING — US US OB COMP LESS 14 WK
1 series · 14 of 28 positions shown · non-contrast
Comparison: None.

CLINICAL DATA: Patient finished antibiotics for UTI 2 days ago then
started having low back pain and generalized pelvic cramps. Patient
is 13 wks pregnant, had normal IUP on ultrasound at OB office 3 wks
ago

EXAM:
OBSTETRIC <14 WK ULTRASOUND
TECHNIQUE: Transabdominal ultrasound was performed for evaluation of the
gestation as well as the maternal uterus and adnexal regions.

[Series 1: us ob comp less 14 wk · 14 of 42 slices shown]
[im 2/42]
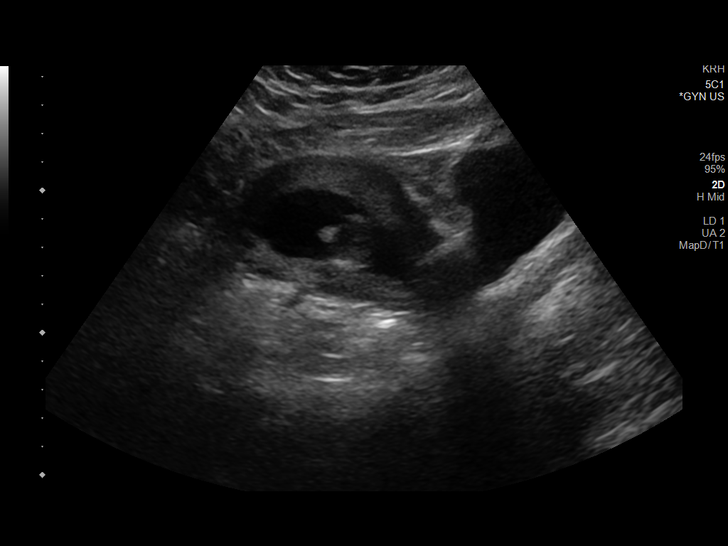
[im 5/42]
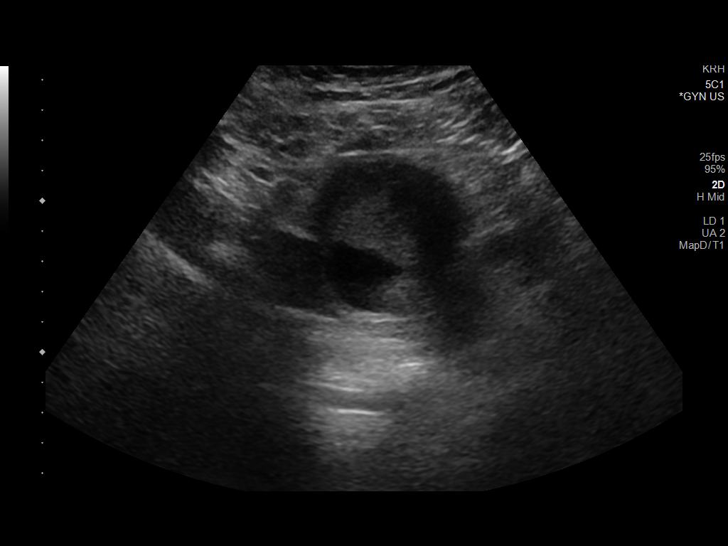
[im 8/42]
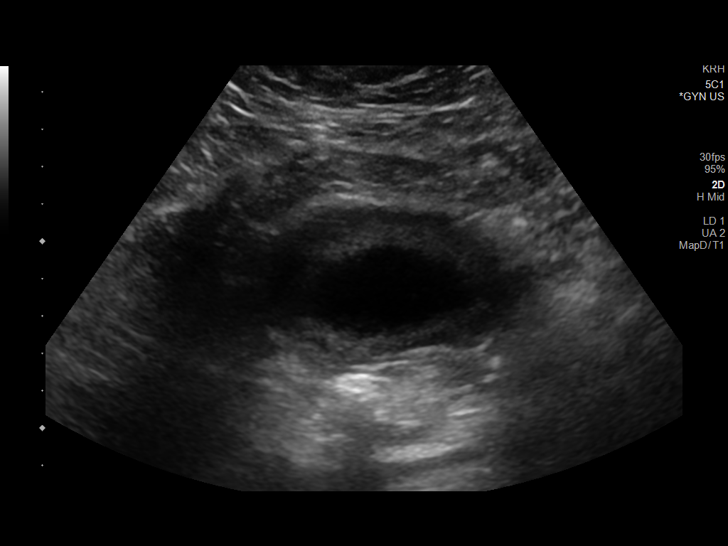
[im 11/42]
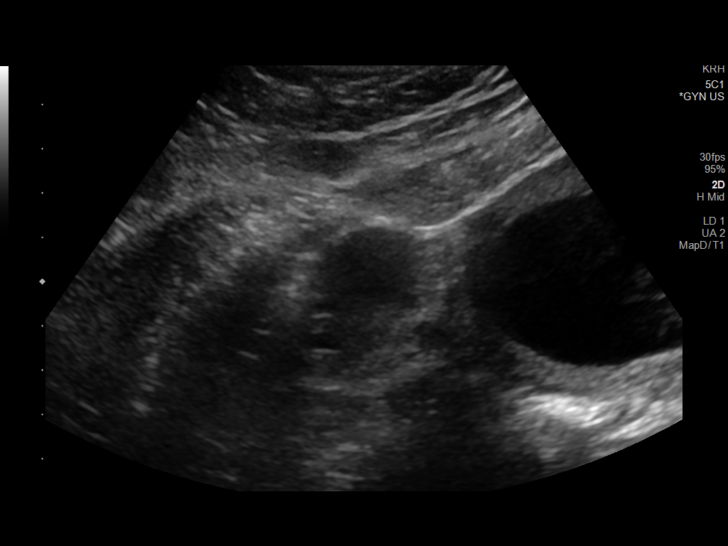
[im 14/42]
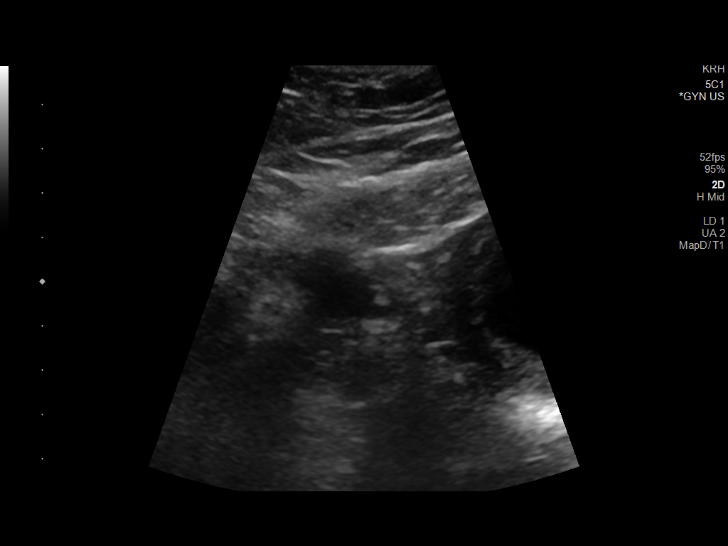
[im 17/42]
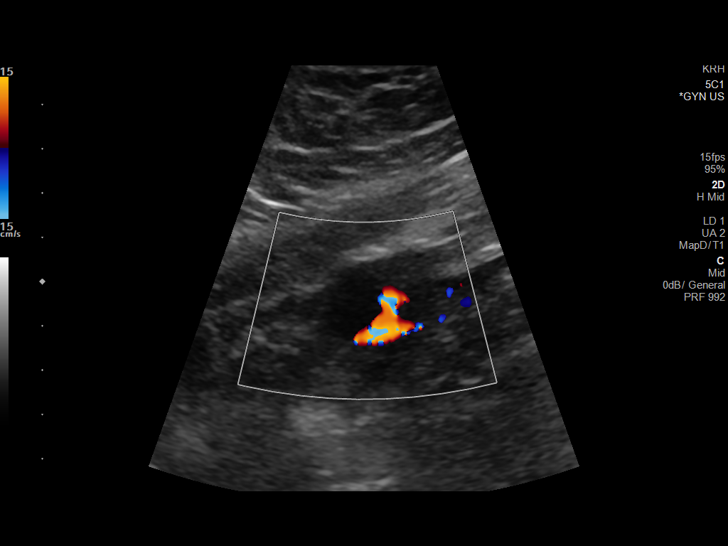
[im 20/42]
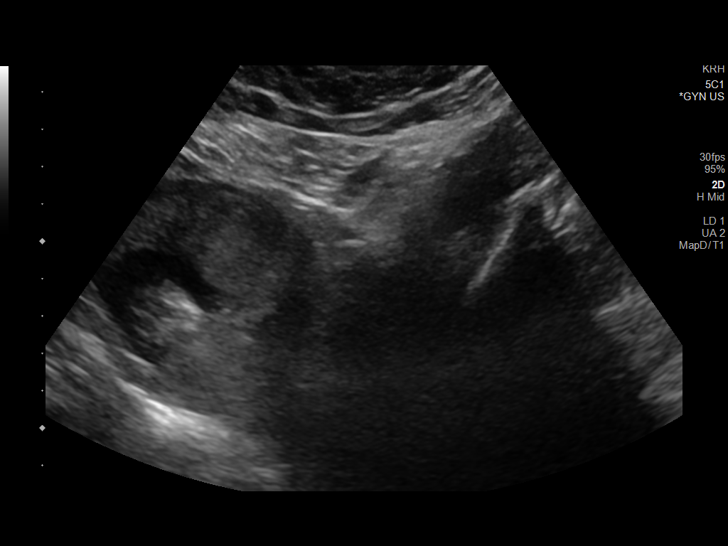
[im 23/42]
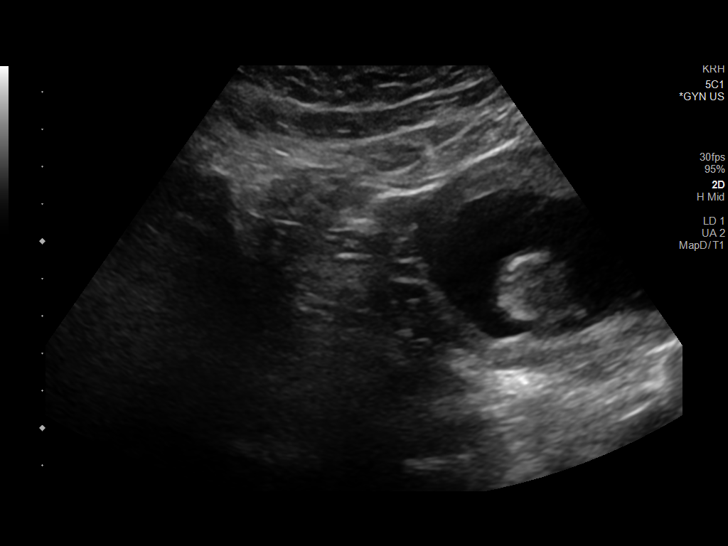
[im 26/42]
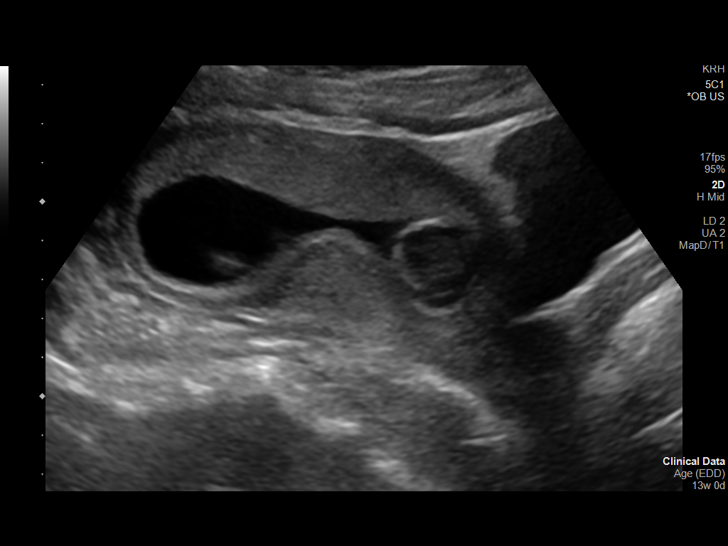
[im 29/42]
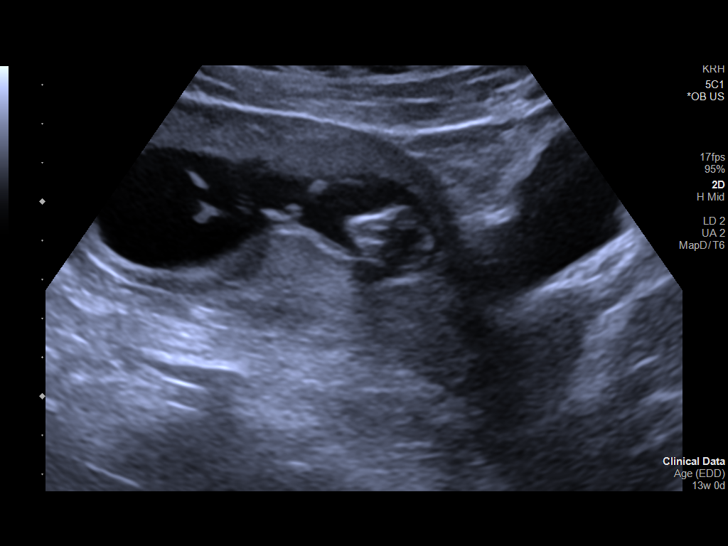
[im 32/42]
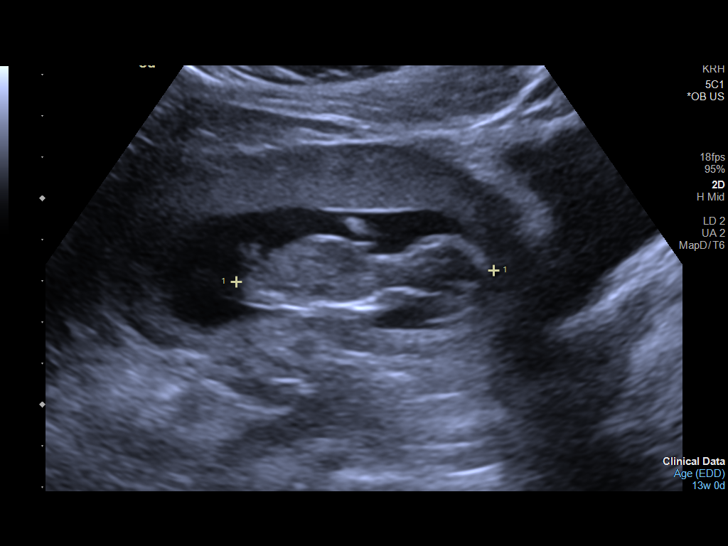
[im 35/42]
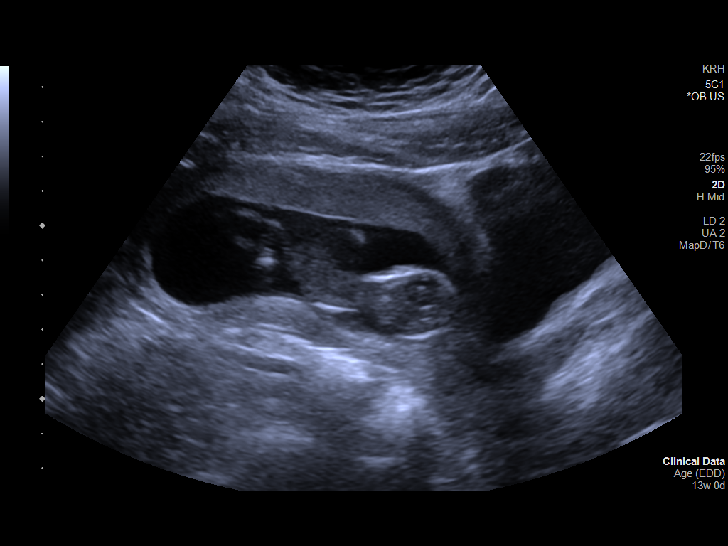
[im 38/42]
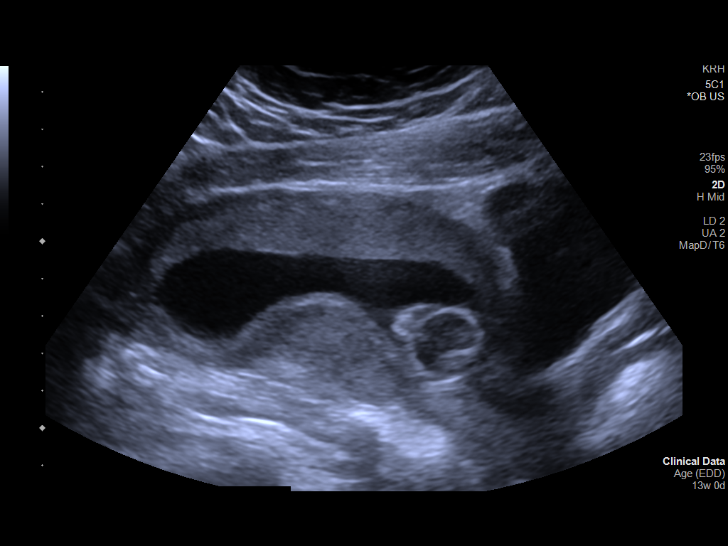
[im 42/42]
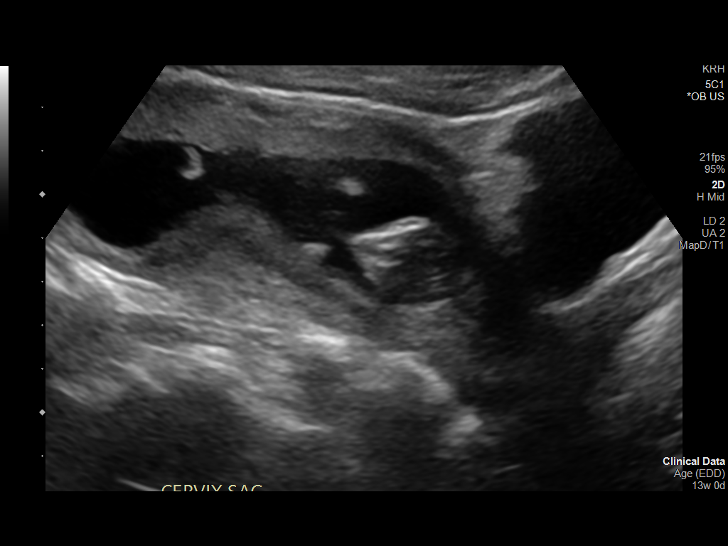

[14 of 28 positions shown; findings below may reference images not displayed]

FINDINGS: Intrauterine gestational sac: Single

Yolk sac:  Not Visualized.

Embryo:  Visualized.

Cardiac Activity: Visualized.

Heart Rate: 158 bpm

CRL:   62.4 mm   12 w 5 d                  US EDC: 02/08/2022

Subchorionic hemorrhage:  None visualized.

Maternal uterus/adnexae: Right ovary unremarkable. Left ovary not
visualized. No adnexal masses. No uterine masses. No pelvic free
fluid.
IMPRESSION: 1. Single live intrauterine pregnancy with a measured gestational
age of 12 weeks and 5 days. No pregnancy complication.
